# Patient Record
Sex: Male | Born: 1965 | Race: Black or African American | Hispanic: No
Health system: Midwestern US, Academic
[De-identification: ages and names within clinical notes are randomized; demographics above are authoritative.]

---

## 2006-03-31 ENCOUNTER — Ambulatory Visit (HOSPITAL_COMMUNITY): Admission: RE | Admit: 2006-03-31 | Discharge: 2006-03-31 | Payer: Self-pay | Admitting: Orthopedic Surgery

## 2007-08-11 IMAGING — CR DG CHEST 2V
2 series · 2 of 2 positions shown · non-contrast
Comparison: none

CLINICAL DATA: Preoperative respiratory exam for orthopedic surgery.  Smoking history.  Sleep apnea. 
 CHEST - 2 VIEW:

[view not recorded (1 of 2)]
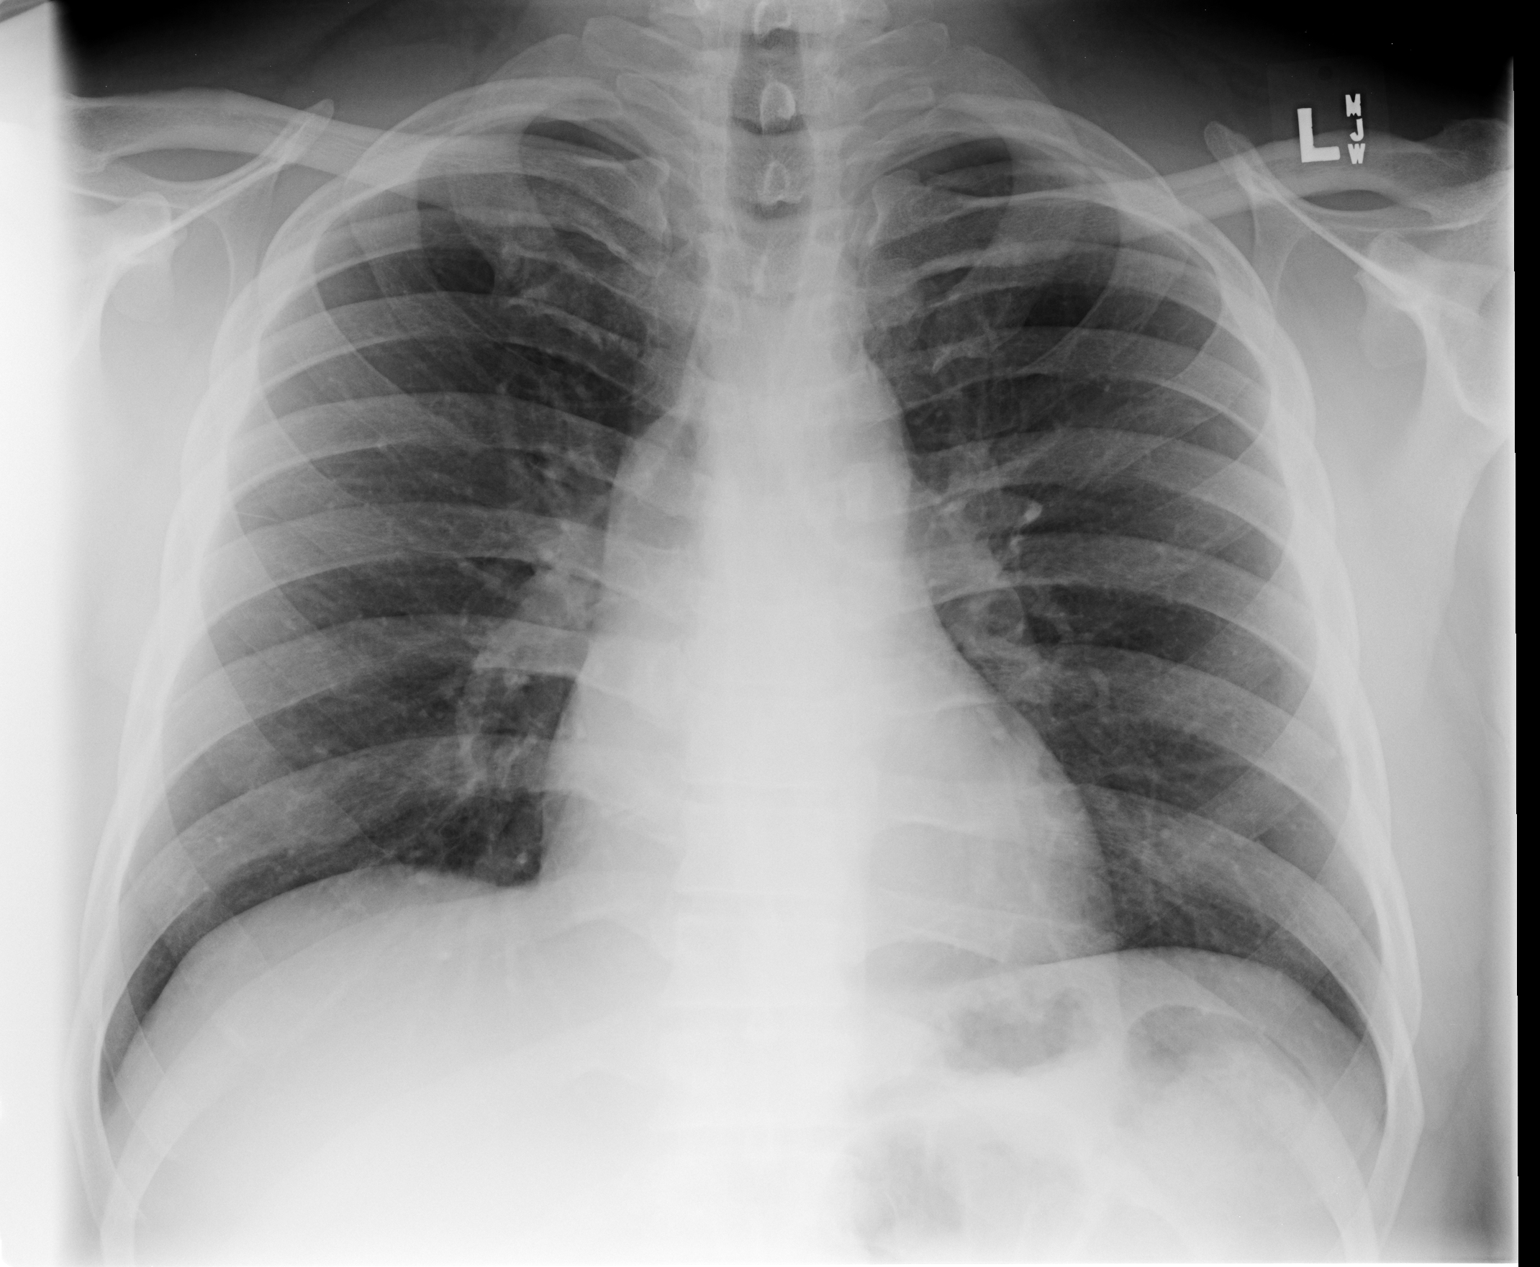

[view not recorded (2 of 2)]
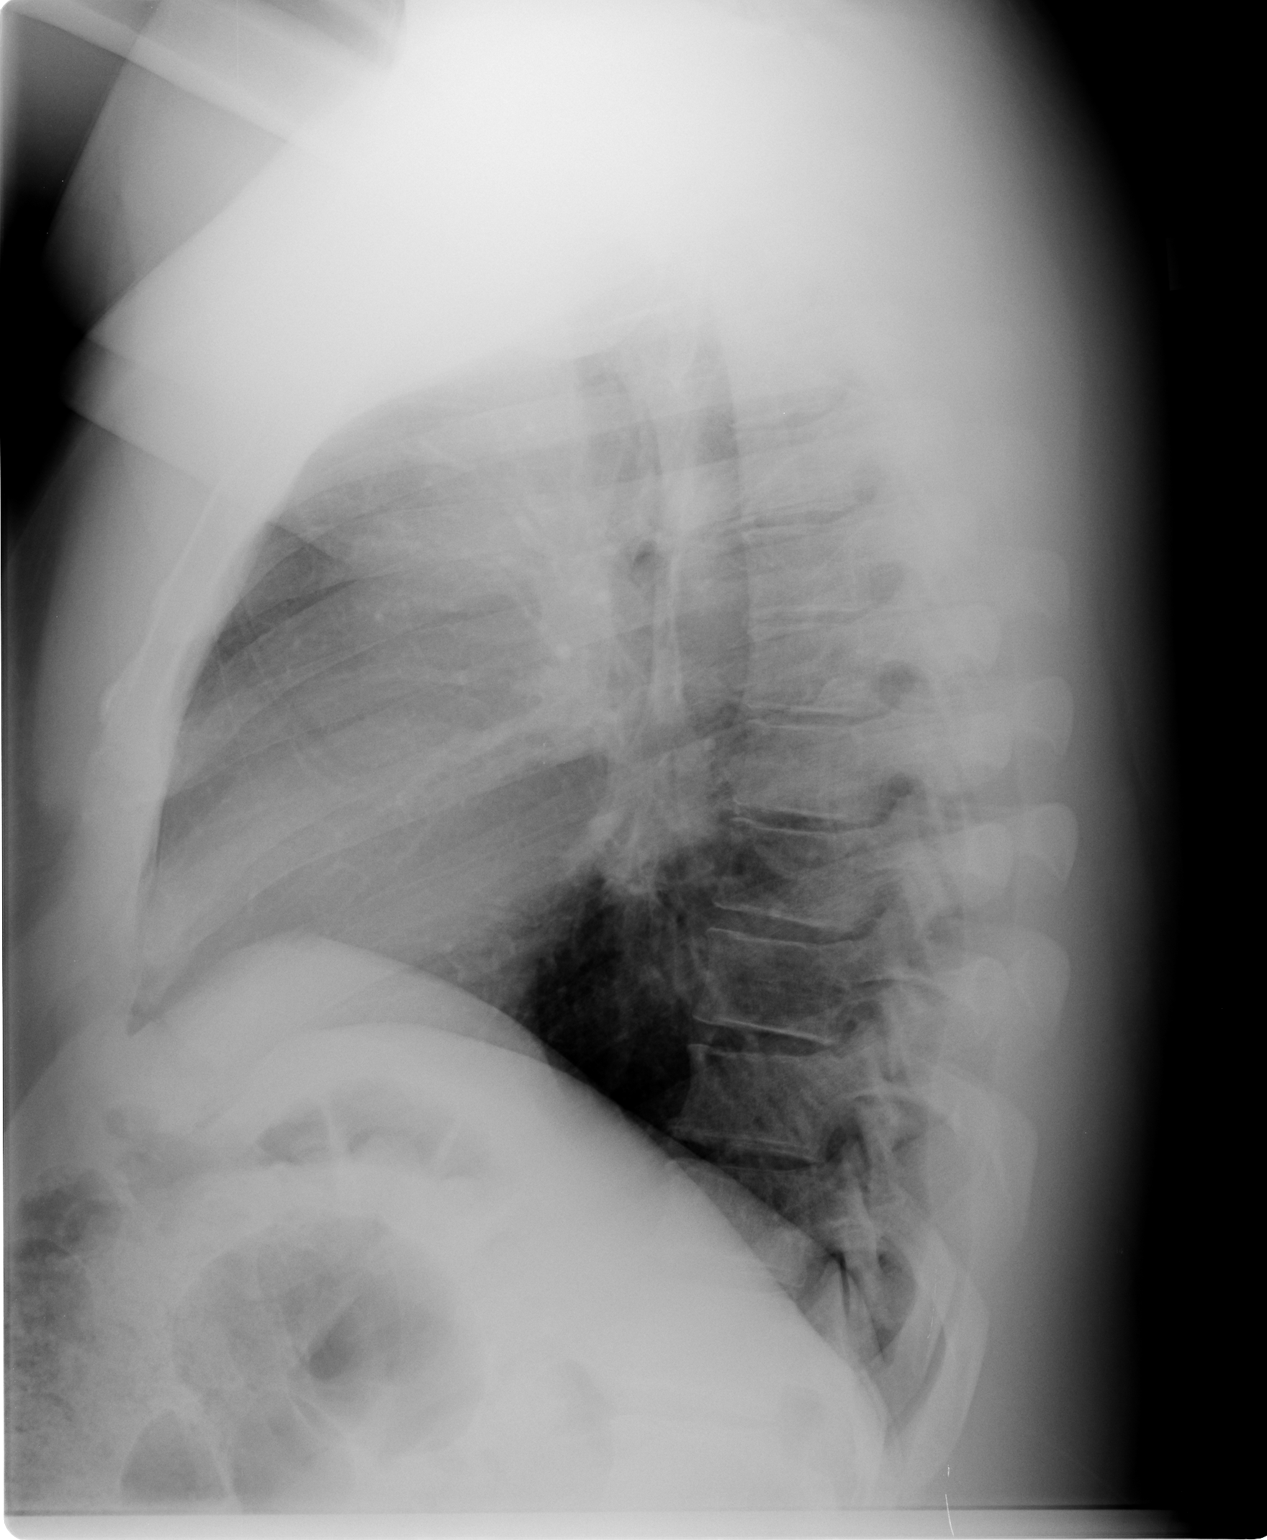

[2 of 2 positions shown; findings below may reference images not displayed]

FINDINGS: The heart size and mediastinal contours are within normal limits.  Both lungs are clear.  The visualized skeletal structures are unremarkable.
IMPRESSION: No active cardiopulmonary disease.

## 2013-10-27 ENCOUNTER — Telehealth: Payer: Self-pay | Admitting: Radiology

## 2013-10-27 NOTE — Telephone Encounter (Signed)
Patient wants order for CPAP, most recent sleep study was 2006, please advise if you can order (pended). Titration suggested setting machine at 16 cm H2O

## 2013-10-27 NOTE — Telephone Encounter (Signed)
Sorry--needs PCP to order(could reestablish here with someone else for CPE to get process restarted)

## 2013-11-01 NOTE — Telephone Encounter (Signed)
Thanks left message for patient to advise.

## 2016-02-21 ENCOUNTER — Ambulatory Visit
Admit: 2016-02-21 | Discharge: 2016-02-21 | Payer: BLUE CROSS/BLUE SHIELD | Attending: Internal Medicine | Primary: Internal Medicine

## 2016-02-21 DIAGNOSIS — I10 Essential (primary) hypertension: Secondary | ICD-10-CM

## 2016-02-21 NOTE — Progress Notes (Signed)
Subjective:      Patient ID: Marcus Castro is a 50 y.o. male.    Hyperlipidemia   This is a chronic problem. The problem is controlled. Recent lipid tests were reviewed and are normal. He has no history of diabetes. There are no known factors aggravating his hyperlipidemia. Current antihyperlipidemic treatment includes diet change. The current treatment provides significant improvement of lipids. There are no compliance problems.  Risk factors for coronary artery disease include male sex.   Knee Pain    The incident occurred at home. There was no injury mechanism. The pain is present in the left knee and right knee. The patient is experiencing no pain.       Review of Systems   Constitutional: Negative.    HENT: Negative.    Eyes: Negative.    Respiratory: Negative.    Cardiovascular: Negative.    Gastrointestinal: Negative.    Genitourinary: Negative.    Musculoskeletal: Negative.    Skin: Negative.    Neurological: Negative.    Psychiatric/Behavioral: Negative.        Objective:   Physical Exam   Constitutional: He is oriented to person, place, and time. He appears well-developed and well-nourished.   HENT:   Head: Normocephalic and atraumatic.   Left Ear: External ear normal.   Eyes: Conjunctivae and EOM are normal. Pupils are equal, round, and reactive to light.   Neck: Normal range of motion. Neck supple. No thyromegaly present.   Cardiovascular: Normal rate, regular rhythm and normal heart sounds.    No murmur heard.  Pulmonary/Chest: Effort normal and breath sounds normal. No respiratory distress. He has no wheezes. He has no rales.   Abdominal: Soft. Bowel sounds are normal.   Musculoskeletal: Normal range of motion.   Neurological: He is alert and oriented to person, place, and time.   Skin: Skin is warm and dry.   Psychiatric: He has a normal mood and affect.       Assessment:     hyperlipidemia on diet    left knee pain    Right knee pain   Plan:     continue meds    Labs    EKG    Return in 6 months

## 2016-02-22 LAB — HEMOGLOBIN A1C
Hemoglobin A1C: 5.9 %
eAG: 122.6 mg/dL

## 2016-02-22 LAB — CBC
Hematocrit: 42.9 % (ref 40.5–52.5)
Hemoglobin: 14 g/dL (ref 13.5–17.5)
MCH: 30.1 pg (ref 26.0–34.0)
MCHC: 32.6 g/dL (ref 31.0–36.0)
MCV: 92.1 fL (ref 80.0–100.0)
MPV: 9.8 fL (ref 5.0–10.5)
Platelets: 177 10*3/uL (ref 135–450)
RBC: 4.65 M/uL (ref 4.20–5.90)
RDW: 13.2 % (ref 12.4–15.4)
WBC: 4.7 10*3/uL (ref 4.0–11.0)

## 2016-02-22 LAB — COMPREHENSIVE METABOLIC PANEL
ALT: 24 U/L (ref 10–40)
AST: 28 U/L (ref 15–37)
Albumin/Globulin Ratio: 1.8 (ref 1.1–2.2)
Albumin: 4.7 g/dL (ref 3.4–5.0)
Alkaline Phosphatase: 64 U/L (ref 40–129)
Anion Gap: 14 (ref 3–16)
BUN: 21 mg/dL — ABNORMAL HIGH (ref 7–20)
CO2: 27 mmol/L (ref 21–32)
Calcium: 9.8 mg/dL (ref 8.3–10.6)
Chloride: 103 mmol/L (ref 99–110)
Creatinine: 1.1 mg/dL (ref 0.9–1.3)
GFR African American: >60 (ref >60)
GFR Non-African American: >60 (ref >60)
Globulin: 2.6 g/dL
Glucose: 87 mg/dL (ref 70–99)
Potassium: 4.7 mmol/L (ref 3.5–5.1)
Sodium: 144 mmol/L (ref 136–145)
Total Bilirubin: 0.4 mg/dL (ref 0.0–1.0)
Total Protein: 7.3 g/dL (ref 6.4–8.2)

## 2016-02-22 LAB — LIPID PANEL
Cholesterol, Total: 193 mg/dL (ref 0–199)
HDL: 54 mg/dL (ref 40–60)
LDL Calculated: 119 mg/dL — ABNORMAL HIGH (ref <100)
Triglycerides: 101 mg/dL (ref 0–150)
VLDL Cholesterol Calculated: 20 mg/dL

## 2016-02-22 LAB — PSA SCREENING: PSA: 0.73 ng/mL (ref 0.00–4.00)

## 2016-02-23 LAB — TESTOSTERONE FREE AND TOTAL MALE
Sex Hormone Binding: 31 nmol/L (ref 11–80)
Testosterone % Free: 1.8 % (ref 1.6–2.9)
Testosterone Free, Calc: 46 pg/mL — ABNORMAL LOW (ref 47–244)
Total Testosterone: 249 ng/dL — ABNORMAL LOW (ref 300–890)

## 2016-05-23 ENCOUNTER — Ambulatory Visit
Admit: 2016-05-23 | Discharge: 2016-05-23 | Payer: BLUE CROSS/BLUE SHIELD | Attending: Internal Medicine | Primary: Internal Medicine

## 2016-05-23 DIAGNOSIS — G4733 Obstructive sleep apnea (adult) (pediatric): Secondary | ICD-10-CM

## 2016-05-23 NOTE — Progress Notes (Signed)
Subjective:      Patient ID: Marcus Castro is a 50 y.o. male.    Fatigue   This is a chronic problem. The problem has been unchanged. Associated symptoms include fatigue. Nothing aggravates the symptoms. He has tried nothing for the symptoms.   Hypertension   This is a chronic problem. The problem is controlled. Pertinent negatives include no anxiety. There are no associated agents to hypertension. Risk factors for coronary artery disease include diabetes mellitus, male gender and obesity. Past treatments include lifestyle changes.       Review of Systems   Constitutional: Positive for fatigue.   HENT: Negative.    Eyes: Negative.    Respiratory: Negative.    Cardiovascular: Negative.    Gastrointestinal: Negative.    Genitourinary: Negative.    Musculoskeletal: Negative.    Skin: Negative.    Neurological: Negative.    Psychiatric/Behavioral: Negative.        Objective:   Physical Exam   Constitutional: He is oriented to person, place, and time. He appears well-developed and well-nourished.   HENT:   Head: Normocephalic and atraumatic.   Left Ear: External ear normal.   Eyes: Conjunctivae and EOM are normal. Pupils are equal, round, and reactive to light.   Neck: Normal range of motion. Neck supple. No thyromegaly present.   Cardiovascular: Normal rate, regular rhythm and normal heart sounds.    No murmur heard.  Pulmonary/Chest: Effort normal and breath sounds normal. No respiratory distress. He has no wheezes. He has no rales.   Abdominal: Soft. Bowel sounds are normal.   Musculoskeletal: Normal range of motion.   Neurological: He is alert and oriented to person, place, and time.   Skin: Skin is warm and dry.   Psychiatric: He has a normal mood and affect.       Assessment:     chronic fatigue    Daytime sleepiness    Sleep apnea as new diagnosis      Plan:     refer to [facility] for supplies    Return in 3 months

## 2016-08-22 ENCOUNTER — Ambulatory Visit
Admit: 2016-08-22 | Discharge: 2016-08-22 | Payer: BLUE CROSS/BLUE SHIELD | Attending: Internal Medicine | Primary: Internal Medicine

## 2016-08-22 DIAGNOSIS — I10 Essential (primary) hypertension: Secondary | ICD-10-CM

## 2016-08-22 MED ORDER — AMLODIPINE BESYLATE 5 MG PO TABS
5 MG | ORAL_TABLET | Freq: Every day | ORAL | 3 refills | Status: DC
Start: 2016-08-22 — End: 2022-10-29

## 2016-08-22 NOTE — Patient Instructions (Signed)
Patient Education        DASH Diet: Care Instructions  Your Care Instructions    The DASH diet is an eating plan that can help lower your blood pressure. DASH stands for Dietary Approaches to Stop Hypertension. Hypertension is high blood pressure.  The DASH diet focuses on eating foods that are high in calcium, potassium, and magnesium. These nutrients can lower blood pressure. The foods that are highest in these nutrients are fruits, vegetables, low-fat dairy products, nuts, seeds, and legumes. But taking calcium, potassium, and magnesium supplements instead of eating foods that are high in those nutrients does not have the same effect. The DASH diet also includes whole grains, fish, and poultry.  The DASH diet is one of several lifestyle changes your doctor may recommend to lower your high blood pressure. Your doctor may also want you to decrease the amount of sodium in your diet. Lowering sodium while following the DASH diet can lower blood pressure even further than just the DASH diet alone.  Follow-up care is a key part of your treatment and safety. Be sure to make and go to all appointments, and call your doctor if you are having problems. It's also a good idea to know your test results and keep a list of the medicines you take.  How can you care for yourself at home?  Following the DASH diet  ?? Eat 4 to 5 servings of fruit each day. A serving is 1 medium-sized piece of fruit, ?? cup chopped or canned fruit, 1/4 cup dried fruit, or 4 ounces (?? cup) of fruit juice. Choose fruit more often than fruit juice.  ?? Eat 4 to 5 servings of vegetables each day. A serving is 1 cup of lettuce or raw leafy vegetables, ?? cup of chopped or cooked vegetables, or 4 ounces (?? cup) of vegetable juice. Choose vegetables more often than vegetable juice.  ?? Get 2 to 3 servings of low-fat and fat-free dairy each day. A serving is 8 ounces of milk, 1 cup of yogurt, or 1 ?? ounces of cheese.  ?? Eat 6 to 8 servings of grains each day.  A serving is 1 slice of bread, 1 ounce of dry cereal, or ?? cup of cooked rice, pasta, or cooked cereal. Try to choose whole-grain products as much as possible.  ?? Limit lean meat, poultry, and fish to 2 servings each day. A serving is 3 ounces, about the size of a deck of cards.  ?? Eat 4 to 5 servings of nuts, seeds, and legumes (cooked dried beans, lentils, and split peas) each week. A serving is 1/3 cup of nuts, 2 tablespoons of seeds, or ?? cup of cooked beans or peas.  ?? Limit fats and oils to 2 to 3 servings each day. A serving is 1 teaspoon of vegetable oil or 2 tablespoons of salad dressing.  ?? Limit sweets and added sugars to 5 servings or less a week. A serving is 1 tablespoon jelly or jam, ?? cup sorbet, or 1 cup of lemonade.  ?? Eat less than 2,300 milligrams (mg) of sodium a day. If you limit your sodium to 1,500 mg a day, you can lower your blood pressure even more.  Tips for success  ?? Start small. Do not try to make dramatic changes to your diet all at once. You might feel that you are missing out on your favorite foods and then be more likely to not follow the plan. Make small changes, and stick   with them. Once those changes become habit, add a few more changes.  ?? Try some of the following:  ?? Make it a goal to eat a fruit or vegetable at every meal and at snacks. This will make it easy to get the recommended amount of fruits and vegetables each day.  ?? Try yogurt topped with fruit and nuts for a snack or healthy dessert.  ?? Add lettuce, tomato, cucumber, and onion to sandwiches.  ?? Combine a ready-made pizza crust with low-fat mozzarella cheese and lots of vegetable toppings. Try using tomatoes, squash, spinach, broccoli, carrots, cauliflower, and onions.  ?? Have a variety of cut-up vegetables with a low-fat dip as an appetizer instead of chips and dip.  ?? Sprinkle sunflower seeds or chopped almonds over salads. Or try adding chopped walnuts or almonds to cooked vegetables.  ?? Try some vegetarian  meals using beans and peas. Add garbanzo or kidney beans to salads. Make burritos and tacos with mashed pinto beans or black beans.  Where can you learn more?  Go to [email or web address] and sign in to your MyChart account. Enter H967 in the Search Health Information box to learn more about "DASH Diet: Care Instructions."     If you do not have an account, please click on the "Sign Up Now" link.  Current as of: May 24, 2015  Content Version: 11.4  ?? 2006-2017 [employer]. Care instructions adapted under license by [facility]. If you have questions about a medical condition or this instruction, always ask your healthcare professional. [employer] disclaims any warranty or liability for your use of this information.

## 2016-08-22 NOTE — Progress Notes (Signed)
Subjective:      Patient ID: Marcus Castro is a 50 y.o. male.    Here for general follow up. Overall feeling ok.    Has not gotten cpap yet.    Left shoulder tenderness for past 1 year. Worse over past month. Limiting his ROM. No trauma. No heavy lifting that he can think of. Has not tried any medications. 6/10.  Flared up yesterday.    Fatigue unchanged.    ROS otherwise negative          Fatigue   This is a chronic problem. The problem has been unchanged. Associated symptoms include fatigue. Nothing aggravates the symptoms. He has tried nothing for the symptoms.   Hypertension   This is a chronic problem. The problem is controlled. Pertinent negatives include no anxiety. There are no associated agents to hypertension. Risk factors for coronary artery disease include diabetes mellitus, male gender and obesity. Past treatments include lifestyle changes.   Shoulder Pain    The pain is present in the left shoulder. This is a chronic problem. The current episode started more than 1 month ago. There has been no history of extremity trauma. The problem occurs daily. The problem has been unchanged. The quality of the pain is described as aching. The pain is at a severity of 6/10. The pain is moderate. Associated symptoms include an inability to bear weight and a limited range of motion. The symptoms are aggravated by activity. He has tried nothing for the symptoms.       Review of Systems   Constitutional: Positive for fatigue.   HENT: Negative.    Eyes: Negative.    Respiratory: Negative.    Cardiovascular: Negative.    Gastrointestinal: Negative.    Genitourinary: Negative.    Musculoskeletal: Negative.    Skin: Negative.    Neurological: Negative.    Psychiatric/Behavioral: Negative.        Objective:   Physical Exam   Constitutional: He is oriented to person, place, and time. He appears well-developed and well-nourished.   HENT:   Head: Normocephalic and atraumatic.   Left Ear: External ear normal.   Eyes: Conjunctivae  and EOM are normal. Pupils are equal, round, and reactive to light.   Neck: Normal range of motion. Neck supple. No thyromegaly present.   Cardiovascular: Normal rate, regular rhythm and normal heart sounds.    No murmur heard.  Pulmonary/Chest: Effort normal and breath sounds normal. No respiratory distress. He has no wheezes. He has no rales.   Abdominal: Soft. Bowel sounds are normal. He exhibits no distension. There is no tenderness. There is no rebound.   Musculoskeletal: He exhibits no edema or deformity.        Left shoulder: He exhibits decreased range of motion, tenderness (anterior), pain and decreased strength. He exhibits no bony tenderness, no swelling, no effusion and no crepitus.   Neurological: He is alert and oriented to person, place, and time.   Skin: Skin is warm and dry.   Psychiatric: He has a normal mood and affect. His behavior is normal. Judgment and thought content normal.   Nursing note and vitals reviewed.      Assessment:     Chronic fatigue    Daytime sleepiness    Left shoulder pain    Sleep apnea as new diagnosis    Obesity    Hyperlipidemia- spoke to patient regarding LDL, states will work on diet and exercise    Essential HTN- not controlled despite trying exercise  and diet      Plan:      Start amlodipine 5mg  qd  Refer to [facility] for supplies- CPAP    Goal: 3x weekly exercise , 12lbs weight loss by next visit    Recommended  ibuprofen, ortho referral to Dr. Valla LeaverShockley    - Pt encouraged to increase physical activity once discharged    - pt educated on healthy dietary habits - lean meats, vegetables, fruits avoiding fried foods etc.   Labs    Return in 6 weeks follow up of HTN    Franki CabotAkua Jodean Valade, MD  Internal Medicine, PGY-2  Pager: [phone]  08/22/16  5:07 PM    This patient has been staffed and discussed with attending Tonia BroomsKeith Melvin, MD.

## 2016-08-23 LAB — COMPREHENSIVE METABOLIC PANEL
ALT: 23 U/L (ref 10–40)
AST: 29 U/L (ref 15–37)
Albumin/Globulin Ratio: 1.6 (ref 1.1–2.2)
Albumin: 4.5 g/dL (ref 3.4–5.0)
Alkaline Phosphatase: 66 U/L (ref 40–129)
Anion Gap: 13 (ref 3–16)
BUN: 19 mg/dL (ref 7–20)
CO2: 26 mmol/L (ref 21–32)
Calcium: 9.5 mg/dL (ref 8.3–10.6)
Chloride: 102 mmol/L (ref 99–110)
Creatinine: 1 mg/dL (ref 0.9–1.3)
GFR African American: >60 (ref >60)
GFR Non-African American: >60 (ref >60)
Globulin: 2.9 g/dL
Glucose: 87 mg/dL (ref 70–99)
Potassium: 4.2 mmol/L (ref 3.5–5.1)
Sodium: 141 mmol/L (ref 136–145)
Total Bilirubin: 0.3 mg/dL (ref 0.0–1.0)
Total Protein: 7.4 g/dL (ref 6.4–8.2)

## 2016-08-23 LAB — LIPID PANEL
Cholesterol, Total: 203 mg/dL — ABNORMAL HIGH (ref 0–199)
HDL: 55 mg/dL (ref 40–60)
LDL Calculated: 130 mg/dL — ABNORMAL HIGH (ref <100)
Triglycerides: 89 mg/dL (ref 0–150)
VLDL Cholesterol Calculated: 18 mg/dL

## 2016-10-03 ENCOUNTER — Encounter: Attending: Internal Medicine | Primary: Internal Medicine

## 2018-08-17 NOTE — Progress Notes (Signed)
Formatting of this note is different from the original.    Subjective   Subjective:    Marcus Castro is a 52 year old male who presents today with   Chief Complaint   Patient presents with   ? Follow-up     follow up on abnormal lab results      Patient was seen previusly for low back pain with radiation down both legs.  He was referred for physical therapy but has first appt scheduled but note yet obtained.  He denies any changes in symptoms which have impacted his ability to exercise as per his previous level.  He continues to deny saddle anesthesia, numbness or weakness or lower extremities or urinary/bowel symptoms.  His pain level, previously descried as 10/10 now 9/10.    His labs were remarkable for vit D deficiency as well as elevated A1c consistent with prediabetes.  His diet is fairly balanced, but he admits to some processed carbs.  He has family history of diabetes.      His SBP remains elevated despite normalization of DBP. He admits having been drinking beer about every other day, but otherwise follwing low salt diet.      Health Maintenance   Topic Date Due   ? Pneumococcal 0-64 (1 of 1 - PPSV23) 03/14/1972   ? DTap,Tdap,and Td (1 - Tdap) 03/14/1977   ? COLONOSCOPY SCREENING  03/14/2016   ? PSA YEARLY  03/14/2016   ? Shingrix (1) 03/14/2016   ? Influenza Vaccine (1) 05/03/2018   ? Meningococcal conjugate valent 4 (MCV4)  Aged Out     No past medical history on file.    Past Surgical History:   Procedure Laterality Date   ? ANTERIOR CRUCIATE LIGAMENT REPAIR Right        History   Alcohol Use   ? Yes     Comment: Socially     Social History     Tobacco Use   Smoking Status Light Tobacco Smoker   ? Types: Cigars   Smokeless Tobacco Never Used   Tobacco Comment    2 Cigars a week     Ready to quit: Not Answered  Counseling given: Not Answered  Comment: 2 Cigars a week    Family History   Problem Relation Age of Onset   ? High BP Mother    ? Cancer Father         lung   ? Diabetes Maternal Aunt    ? Diabetes  Paternal Cousin    ? Diabetes Maternal Cousin      No Known Allergies    Outpatient Medications Marked as Taking for the 08/17/18 encounter (Office Visit) with Gwendalyn Ege., MD   Medication Sig Dispense Refill   ? Cholecalciferol (VITAMIN D) 50 MCG (2000 UT) CAPS Take 1 tablet by mouth daily for 90 days. 90 capsule 1   ? Blood Pressure Monitoring (BLOOD PRESSURE KIT) KIT Large cuff size 1 each 0   ? ibuprofen (ADVIL,MOTRIN) 200 MG TABS Take 200 mg by mouth every 6 (six) hours as needed for Mild Pain (1-3).       Review of Systems   Gastrointestinal: Negative for abdominal pain.   Genitourinary: Negative for difficulty urinating and frequency.   Musculoskeletal: Positive for back pain. Negative for arthralgias, gait problem, joint swelling and myalgias.   Neurological: Negative for tremors, weakness and numbness.     Vitals:    08/17/18 1726   BP: 150/80   Weight: 290  lb 9.6 oz (131.8 kg)   Height: 71" (180.3 cm)     Body mass index is 40.53 kg/m.         Objective   Objective:  Physical Exam   Constitutional: He is oriented to person, place, and time. He appears well-developed and well-nourished. No distress.   Pulmonary/Chest: No respiratory distress.   Musculoskeletal:        Lumbar back: He exhibits decreased range of motion, tenderness and pain. He exhibits no bony tenderness, no swelling, no edema, no deformity and no spasm.   Neurological: He is alert and oriented to person, place, and time. He has normal strength. No sensory deficit. He exhibits normal muscle tone. Coordination and gait normal.   Psychiatric: He has a normal mood and affect. His behavior is normal.   Nursing note and vitals reviewed.    Procedures    Chronic Disease Management    Assessment & Plan:  Lonnie was seen today for follow-up.    Diagnoses and all orders for this visit:    Elevated blood pressure reading in office without diagnosis of hypertension-likely htn but patient would like to continue to work on diet and  exercise  -may also be pain component to elevation  -discussed sodium restriction, achievng ideal body weight and regular exercise program when able, as physiologic means to achieve blood pressure control. Consider stationary bike.  The patient will strive towards this. The patient indicates understanding of these issues and agrees with the plan  -home blood pressure monitoring with goal <140/90; patient to return sooner if trending over goal range    Prediabetes-extensive discussion re potential progression to diabetes without intervention.  Will start with low carb diet, avoiding processed foods and limiting starchy carbs in diet; regular mealtimes; graduated low impact exercise regimen  -consider medication management if A1c unimproved/worsening with diet and exercise management  -     HEMOGLOBIN A1C; Future-3 months    Vitamin D deficiency-repeat after 6 months supplementation  -counseled patient re role of vitamin D and potential effects deficiency, in addition to treatment protocol     Chronic bilateral low back pain without sciatica-await therapy appt  -     AMB REFERRAL TO [facility]; Future    Morbid obesity with BM of 40-44.9, adult-Extensive discussion re diagnosis of obesity and the negative impact on co-existing conditions.  Extensive counseling re low carb diet with avoidance of processed/starchy carbs in diet as well as sugar-sweetened beverages.  Patient educated re need for regular mealtimes and importance of not skipping/bingeing meals.  Avoid fast food chains and vending machine products.  Incorporate consistent exercise regimen as tolerated due to back pain with goal of a minimum 30 min daily for at least 4 days per week to achieve weight loss rather than just maintaining current weight.  Adding 2-3 days light resistance training is also helpful to build lean body weight with more fat-burning capacity.  Limit weight training to seated machines rather than free weights or dead lifts  while back pain persists.   Advised patient to monitor progress with changes in endurance and clothing fit rather than weighing on scale, which will fluctuate and tend to increase both anxiety and frustration.    Other orders  -     Cholecalciferol (VITAMIN D) 50 MCG (2000 UT) CAPS; Take 1 tablet by mouth daily for 90 days.  -     Blood Pressure Monitoring (BLOOD PRESSURE KIT) KIT; Large cuff size  Return in about 3 months (around 11/16/2018), or sooner if new or worsening symptms.  Electronically signed by Gwendalyn Ege., MD at 08/17/2018  9:47 PM EST

## 2019-12-24 ENCOUNTER — Ambulatory Visit: Admit: 2019-12-24 | Discharge: 2019-12-24 | Payer: PRIVATE HEALTH INSURANCE

## 2019-12-24 ENCOUNTER — Ambulatory Visit: Admit: 2019-12-24 | Payer: PRIVATE HEALTH INSURANCE

## 2019-12-24 DIAGNOSIS — Z0000 Encounter for general adult medical examination without abnormal findings: Secondary | ICD-10-CM

## 2019-12-24 LAB — COMPREHENSIVE METABOLIC PANEL, SERUM
ALT: 28 U/L (ref 7–52)
AST (SGOT): 32 U/L (ref 13–39)
Albumin: 4.3 g/dL (ref 3.5–5.7)
Alkaline Phosphatase: 60 U/L (ref 36–125)
Anion Gap: 7 mmol/L (ref 3–16)
BUN: 19 mg/dL (ref 7–25)
CO2: 30 mmol/L (ref 21–33)
Calcium: 9.9 mg/dL (ref 8.6–10.3)
Chloride: 107 mmol/L (ref 98–110)
Creatinine: 0.99 mg/dL (ref 0.60–1.30)
Glucose: 100 mg/dL (ref 70–100)
Osmolality, Calculated: 300 mOsm/kg (ref 278–305)
Potassium: 4.8 mmol/L (ref 3.5–5.3)
Sodium: 144 mmol/L (ref 133–146)
Total Bilirubin: 0.6 mg/dL (ref 0.0–1.5)
Total Protein: 7.1 g/dL (ref 6.4–8.9)
eGFR AA CKD-EPI: >90 See note.
eGFR NONAA CKD-EPI: 87 See note.

## 2019-12-24 LAB — HEMOGLOBIN A1C: Hemoglobin A1C: 5.9 % (ref 4.0–5.6)

## 2019-12-24 LAB — LIPID PANEL
Cholesterol, Total: 171 mg/dL (ref 0–200)
HDL: 62 mg/dL (ref 60–92)
LDL Cholesterol: 98 mg/dL
Triglycerides: 56 mg/dL (ref 10–149)

## 2019-12-24 LAB — HEPATITIS C ANTIBODY: HCV Ab: NONREACTIVE

## 2019-12-24 LAB — PSA TOTAL, SCREENING: PSA: 0.52 ng/mL (ref 0.0–4.0)

## 2019-12-24 LAB — HIV 1+2 ANTIBODY/ANTIGEN WITH REFLEX: HIV 1+2 AB/AGN: NONREACTIVE

## 2019-12-24 NOTE — Unmapped (Signed)
Subjective:       Patient ID: Ronald Oconnor is a 54 y.o. male.    HPI  OSA, low back pain  lbp that radiates down left side. feels it in the morning the builds up during the day. Can feel it over night. Difficult to stand straight, leaning back helps.. stretching has helped for back. Losing weight has helped. Pt was 286lbs and losing weight has helped.. no numbness or tingling. Pt has physical job in [employer].has some weakness with lifting     Intermittent fasting, latest meal at 6pm,5 out of 7 days of veggies  Stretching and treadmill/rowing    Has sleep apnea    The following portions of the patient's history were reviewed and updated as appropriate: allergies, current medications, past family history, past medical history, past social history, past surgical history and problem list.        Review of Systems   Constitutional: Positive for weight loss. Negative for weight gain.   HENT: Negative for congestion, hearing loss and rhinorrhea.    Eyes: Positive for visual disturbance (floaters right eye).   Respiratory: Negative for shortness of breath.    Cardiovascular: Negative for chest pain.   Gastrointestinal: Negative for constipation, diarrhea and heartburn.   Genitourinary: Positive for urgency. Negative for difficulty urinating and nocturia.   Musculoskeletal: Positive for arthralgias (mild shoulder pain at times) and back pain.   Neurological: Negative for headaches.   Psychiatric/Behavioral: Negative for depression and sleep disturbance.       Objective:    Physical Exam  Vitals signs and nursing note reviewed.   Constitutional:       Appearance: He is well-developed.   HENT:      Head: Normocephalic and atraumatic.      Right Ear: Tympanic membrane, ear canal and external ear normal.      Left Ear: Tympanic membrane, ear canal and external ear normal.   Eyes:      Conjunctiva/sclera: Conjunctivae normal.      Pupils: Pupils are equal, round, and reactive to light.   Neck:      Musculoskeletal: Normal  range of motion and neck supple.      Thyroid: No thyromegaly.      Trachea: No tracheal deviation.   Cardiovascular:      Rate and Rhythm: Normal rate and regular rhythm.      Heart sounds: Normal heart sounds.   Pulmonary:      Effort: Pulmonary effort is normal. No respiratory distress.      Breath sounds: Normal breath sounds. No wheezing or rales.   Abdominal:      General: Bowel sounds are normal. There is no distension.      Palpations: Abdomen is soft.      Tenderness: There is no abdominal tenderness. There is no guarding or rebound.   Musculoskeletal:         General: No tenderness.      Right lower leg: No edema.      Left lower leg: No edema.      Comments: Negative slr  +apley left shoulder   Lymphadenopathy:      Cervical: No cervical adenopathy.   Skin:     General: Skin is warm and dry.   Neurological:      General: No focal deficit present.      Mental Status: He is alert and oriented to person, place, and time.   Psychiatric:         Behavior: Behavior  normal.         Thought Content: Thought content normal.       Vitals:    12/24/19 0823   BP: (!) 163/92   Pulse: 74   SpO2: 100%     BP: 148/89           90 degree rigt shoulder abduction  75 degrees left abduction  Assessment:   Please see below     Plan:   Annual exam      goal:240lbs    htn-

## 2019-12-24 NOTE — Unmapped (Addendum)
Back Exercises  If you have pain in your back, do these exercises 2-3 times each day or as told by your doctor. When the pain goes away, do the exercises once each day, but repeat the steps more times for each exercise (do more repetitions). If you do not have pain in your back, do these exercises once each day or as told by your doctor.  Exercises  Single Knee to Chest    Do these steps 3-5 times in a row for each leg:  1. Lie on your back on a firm bed or the floor with your legs stretched out.  2. Bring one knee to your chest.  3. Hold your knee to your chest by grabbing your knee or thigh.  4. Pull on your knee until you feel a gentle stretch in your lower back.  5. Keep doing the stretch for 10-30 seconds.  6. Slowly let go of your leg and straighten it.  Pelvic Tilt    Do these steps 5-10 times in a row:  1. Lie on your back on a firm bed or the floor with your legs stretched out.  2. Bend your knees so they point up to the ceiling. Your feet should be flat on the floor.  3. Tighten your lower belly (abdomen) muscles to press your lower back against the floor. This will make your tailbone point up to the ceiling instead of pointing down to your feet or the floor.  4. Stay in this position for 5-10 seconds while you gently tighten your muscles and breathe evenly.  Cat-Cow    Do these steps until your lower back bends more easily:  1. Get on your hands and knees on a firm surface. Keep your hands under your shoulders, and keep your knees under your hips. You may put padding under your knees.  2. Let your head hang down, and make your tailbone point down to the floor so your lower back is round like the back of a cat.  3. Stay in this position for 5 seconds.  4. Slowly lift your head and make your tailbone point up to the ceiling so your back hangs low (sags) like the back of a cow.  5. Stay in this position for 5 seconds.  Press-Ups    Do these steps 5-10 times in a row:  1. Lie on your belly (face-down) on the  floor.  2. Place your hands near your head, about shoulder-width apart.  3. While you keep your back relaxed and keep your hips on the floor, slowly straighten your arms to raise the top half of your body and lift your shoulders. Do not use your back muscles. To make yourself more comfortable, you may change where you place your hands.  4. Stay in this position for 5 seconds.  5. Slowly return to lying flat on the floor.  Bridges    Do these steps 10 times in a row:  1. Lie on your back on a firm surface.  2. Bend your knees so they point up to the ceiling. Your feet should be flat on the floor.  3. Tighten your butt muscles and lift your butt off of the floor until your waist is almost as high as your knees. If you do not feel the muscles working in your butt and the back of your thighs, slide your feet 1-2 inches farther away from your butt.  4. Stay in this position for 3-5 seconds.  5. Slowly   lower your butt to the floor, and let your butt muscles relax.  If this exercise is too easy, try doing it with your arms crossed over your chest.  Belly Crunches    Do these steps 5-10 times in a row:  1. Lie on your back on a firm bed or the floor with your legs stretched out.  2. Bend your knees so they point up to the ceiling. Your feet should be flat on the floor.  3. Cross your arms over your chest.  4. Tip your chin a little bit toward your chest but do not bend your neck.  5. Tighten your belly muscles and slowly raise your chest just enough to lift your shoulder blades a tiny bit off of the floor.  6. Slowly lower your chest and your head to the floor.  Back Lifts  Do these steps 5-10 times in a row:  1. Lie on your belly (face-down) with your arms at your sides, and rest your forehead on the floor.  2. Tighten the muscles in your legs and your butt.  3. Slowly lift your chest off of the floor while you keep your hips on the floor. Keep the back of your head in line with the curve in your back. Look at the floor  while you do this.  4. Stay in this position for 3-5 seconds.  5. Slowly lower your chest and your face to the floor.    DASH Eating Plan  DASH stands for Dietary Approaches to Stop Hypertension. The DASH eating plan is a healthy eating plan that has been shown to reduce high blood pressure (hypertension). It may also reduce your risk for type 2 diabetes, heart disease, and stroke. The DASH eating plan may also help with weight loss.  What are tips for following this plan?  General guidelines  ?? Avoid eating more than 2,300 mg (milligrams) of salt (sodium) a day. If you have hypertension, you may need to reduce your sodium intake to 1,500 mg a day.  ?? Limit alcohol intake to no more than 1 drink a day for nonpregnant women and 2 drinks a day for men. One drink equals 12 oz of beer, 5 oz of wine, or 1?? oz of hard liquor.  ?? Work with your health care provider to maintain a healthy body weight or to lose weight. Ask what an ideal weight is for you.  ?? Get at least 30 minutes of exercise that causes your heart to beat faster (aerobic exercise) most days of the week. Activities may include walking, swimming, or biking.  ?? Work with your health care provider or diet and nutrition specialist (dietitian) to adjust your eating plan to your individual calorie needs.  Reading food labels  ?? Check food labels for the amount of sodium per serving. Choose foods with less than 5 percent of the Daily Value of sodium. Generally, foods with less than 300 mg of sodium per serving fit into this eating plan.  ?? To find whole grains, look for the word whole as the first word in the ingredient list.  Shopping  ?? Buy products labeled as low-sodium or no salt added.  ?? Buy fresh foods. Avoid canned foods and premade or frozen meals.  Cooking  ?? Avoid adding salt when cooking. Use salt-free seasonings or herbs instead of table salt or sea salt. Check with your health care provider or pharmacist before using salt substitutes.  ?? Do  not fry foods. Cook foods  using healthy methods such as baking, boiling, grilling, and broiling instead.  ?? Cook with heart-healthy oils, such as olive, canola, soybean, or sunflower oil.  Meal planning    ?? Eat a balanced diet that includes:  ?? 5 or more servings of fruits and vegetables each day. At each meal, try to fill half of your plate with fruits and vegetables.  ?? Up to 6-8 servings of whole grains each day.  ?? Less than 6 oz of lean meat, poultry, or fish each day. A 3-oz serving of meat is about the same size as a deck of cards. One egg equals 1 oz.  ?? 2 servings of low-fat dairy each day.  ?? A serving of nuts, seeds, or beans 5 times each week.  ?? Heart-healthy fats. Healthy fats called Omega-3 fatty acids are found in foods such as flaxseeds and coldwater fish, like sardines, salmon, and mackerel.  ?? Limit how much you eat of the following:  ?? Canned or prepackaged foods.  ?? Food that is high in trans fat, such as fried foods.  ?? Food that is high in saturated fat, such as fatty meat.  ?? Sweets, desserts, sugary drinks, and other foods with added sugar.  ?? Full-fat dairy products.  ?? Do not salt foods before eating.  ?? Try to eat at least 2 vegetarian meals each week.  ?? Eat more home-cooked food and less restaurant, buffet, and fast food.  ?? When eating at a restaurant, ask that your food be prepared with less salt or no salt, if possible.  What foods are recommended?  The items listed may not be a complete list. Talk with your dietitian about what dietary choices are best for you.  Grains  Whole-grain or whole-wheat bread. Whole-grain or whole-wheat pasta. Brown rice. Orpah Cobb. Bulgur. Whole-grain and low-sodium cereals. Pita bread. Low-fat, low-sodium crackers. Whole-wheat flour tortillas.  Vegetables  Fresh or frozen vegetables (raw, steamed, roasted, or grilled). Low-sodium or reduced-sodium tomato and vegetable juice. Low-sodium or reduced-sodium tomato sauce and tomato paste. Low-sodium or  reduced-sodium canned vegetables.  Fruits  All fresh, dried, or frozen fruit. Canned fruit in natural juice (without added sugar).  Meat and other protein foods  Skinless chicken or [place]. Ground chicken or [place]. Pork with fat trimmed off. Fish and seafood. Egg whites. Dried beans, peas, or lentils. Unsalted nuts, nut butters, and seeds. Unsalted canned beans. Lean cuts of beef with fat trimmed off. Low-sodium, lean deli meat.  Dairy  Low-fat (1%) or fat-free (skim) milk. Fat-free, low-fat, or reduced-fat cheeses. Nonfat, low-sodium ricotta or cottage cheese. Low-fat or nonfat yogurt. Low-fat, low-sodium cheese.  Fats and oils  Soft margarine without trans fats. Vegetable oil. Low-fat, reduced-fat, or light mayonnaise and salad dressings (reduced-sodium). Canola, safflower, olive, soybean, and sunflower oils. Avocado.  Seasoning and other foods  Herbs. Spices. Seasoning mixes without salt. Unsalted popcorn and pretzels. Fat-free sweets.  What foods are not recommended?  The items listed may not be a complete list. Talk with your dietitian about what dietary choices are best for you.  Grains  Baked goods made with fat, such as croissants, muffins, or some breads. Dry pasta or rice meal packs.  Vegetables  Creamed or fried vegetables. Vegetables in a cheese sauce. Regular canned vegetables (not low-sodium or reduced-sodium). Regular canned tomato sauce and paste (not low-sodium or reduced-sodium). Regular tomato and vegetable juice (not low-sodium or reduced-sodium). Rosita Fire. Olives.  Fruits  Canned fruit in a light or heavy syrup. Foy [place]  fruit. Fruit in cream or butter sauce.  Meat and other protein foods  Fatty cuts of meat. Ribs. Fried meat. Tomasa Blase. Sausage. Bologna and other processed lunch meats. Salami. Fatback. Hotdogs. Bratwurst. Salted nuts and seeds. Canned beans with added salt. Canned or smoked fish. Whole eggs or egg yolks. Chicken or [place] with skin.  Dairy  Whole or 2% milk, cream, and half-and-half.  Whole or full-fat cream cheese. Whole-fat or sweetened yogurt. Full-fat cheese. Nondairy creamers. Whipped toppings. Processed cheese and cheese spreads.  Fats and oils  Butter. Stick margarine. Lard. Shortening. Ghee. Bacon fat. Tropical oils, such as coconut, palm kernel, or palm oil.  Seasoning and other foods  Salted popcorn and pretzels. Onion salt, garlic salt, seasoned salt, table salt, and sea salt. Worcestershire sauce. Tartar sauce. Barbecue sauce. Teriyaki sauce. Soy sauce, including reduced-sodium. Steak sauce. Canned and packaged gravies. Fish sauce. Oyster sauce. Cocktail sauce. Horseradish that you find on the shelf. Ketchup. Mustard. Meat flavorings and tenderizers. Bouillon cubes. Hot sauce and Tabasco sauce. Premade or packaged marinades. Premade or packaged taco seasonings. Relishes. Regular salad dressings.  Where to find more information:  ?? National Heart, Lung, and Blood Institute: PopSteam.is  ?? American Heart Association: www.heart.org  Summary  ?? The DASH eating plan is a healthy eating plan that has been shown to reduce high blood pressure (hypertension). It may also reduce your risk for type 2 diabetes, heart disease, and stroke.  ?? With the DASH eating plan, you should limit salt (sodium) intake to 2,300 mg a day. If you have hypertension, you may need to reduce your sodium intake to 1,500 mg a day.  ?? When on the DASH eating plan, aim to eat more fresh fruits and vegetables, whole grains, lean proteins, low-fat dairy, and heart-healthy fats.  ?? Work with your health care provider or diet and nutrition specialist (dietitian) to adjust your eating plan to your individual calorie needs.  This information is not intended to replace advice given to you by your health care provider. Make sure you discuss any questions you have with your health care provider.  Document Released: 08/08/2011 Document Revised: 08/12/2016 Document Reviewed: 08/12/2016  Elsevier Interactive Patient Education  ?? 2017 Elsevier Inc.  Contact a doctor if:  ?? Your back pain gets a lot worse when you do an exercise.  ?? Your back pain does not lessen 2 hours after you exercise.  If you have any of these problems, stop doing the exercises. Do not do them again unless your doctor says it is okay.  Get help right away if:  ?? You have sudden, very bad back pain. If this happens, stop doing the exercises. Do not do them again unless your doctor says it is okay.

## 2020-01-18 ENCOUNTER — Ambulatory Visit: Admit: 2020-01-18 | Payer: PRIVATE HEALTH INSURANCE

## 2020-01-18 ENCOUNTER — Inpatient Hospital Stay: Admit: 2020-01-18 | Payer: PRIVATE HEALTH INSURANCE

## 2020-01-18 DIAGNOSIS — M19072 Primary osteoarthritis, left ankle and foot: Secondary | ICD-10-CM

## 2020-01-18 DIAGNOSIS — M722 Plantar fascial fibromatosis: Secondary | ICD-10-CM

## 2020-01-18 MED ORDER — predniSONE (DELTASONE) 10 MG tablet
10 | ORAL_TABLET | ORAL | 0 refills | Status: AC
Start: 2020-01-18 — End: 2020-11-14

## 2020-01-18 NOTE — Unmapped (Signed)
Per [facility] protocols, apt offered and accepted for within 72 hours.  Pt requested appt for today        Reason for Disposition  ??? MODERATE pain (e.g., interferes with normal activities, limping) and present > 3 days    Answer Assessment - Initial Assessment Questions  1. ONSET: When did the pain start?       Started about Friday / Saturday -Started as tender. Last two days, unable to pressure on the foot  2. LOCATION: Where is the pain located?       Left foot only -  Some soreness into the ankle -mainly on the bottom  Plantar fascitis?  Some swelling  3. PAIN: How bad is the pain?    (Scale 1-10; or mild, moderate, severe)    -  MILD (1-3): doesn't interfere with normal activities     -  MODERATE (4-7): interferes with normal activities (e.g., work or school) or awakens from sleep, limping     -  SEVERE (8-10): excruciating pain, unable to do any normal activities, unable to walk      8/10  At rest - less so, but more of an annoyance  4. WORK OR EXERCISE: Has there been any recent work or exercise that involved this part of the body?       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 CAUSE: What do you think is causing the foot pain?      Unsure - has been wearing some walking shoes  Has not yet tried arch support yet  6. OTHER SYMPTOMS: Do you have any other symptoms? (e.g., leg pain, rash, fever, numbness)      no    Protocols used: FOOT PAIN-A-[place]

## 2020-01-18 NOTE — Unmapped (Signed)
[facility]  [facility] INTERNAL MEDICINE AT [facility]  [address]  [place] [address]    Name:  Ronald Oconnor   MRN: [id number]  Date of Birth: Oct 08, 1965  Age: 54 y.o.       Chief complaint:  Chief Complaint   Patient presents with   ??? Foot Pain     left       HPI:  HPI    Patient here for acute visit for left foot pain. Started about 5 days ago. No trauma to the area. Got a new job at [employer] where he is walking on a lot of concrete. Pain mainly in the heel. Got a little better over the weekend but got acutely worse yesterday. Tries to avoid putting weight on the heel. Endorses swelling to the foot and pain when pressing on the calcaneus. No inflammation in the toes. Has had gout before but this is not the area where he regularly gets gout.     Pertinent ROS as above.    Problem List:  Patient Active Problem List   Diagnosis   ??? Essential hypertension   ??? Annual physical exam         Current Outpatient Medications:  Current Outpatient Medications   Medication Sig Dispense Refill   ??? predniSONE (DELTASONE) 10 MG tablet Take 4 tabs by mouth daily for 3 days, then 3 tabs daily for 3 days, then 2 tabs daily for 3 days, then 1 tab daily for 3 days, then stop. 30 tablet 0     No current facility-administered medications for this visit.        Allergies:  No Known Allergies    Vitals:    01/18/20 1125   BP: 139/83   BP Location: Left arm   Patient Position: Sitting   BP Cuff Size: Large   Pulse: 78   Temp: 97.5 ??F (36.4 ??C)   TempSrc: Skin   SpO2: 100%   Weight: (!) 264 lb (119.7 kg)   Height: 6' (1.829 m)     Body mass index is 35.8 kg/m??.    Physical Exam  Constitutional:       Appearance: He is obese.   Pulmonary:      Effort: Pulmonary effort is normal. No respiratory distress.   Musculoskeletal:      Comments: Has left shoe very lightly tied. Hurts when taking shoe on and off. Some nonspecific swelling of the left foot, no redness or induration. Only pain when pressing on the left heel  but no pain to light pressing. Pain with plantarflexion.   Neurological:      Mental Status: He is alert.   Psychiatric:         Mood and Affect: Mood normal.           Assessment and Plan:  1. Plantar fasciitis of left foot  predniSONE (DELTASONE) 10 MG tablet   2. Pain of left heel  X-ray Foot Left min 3-views     Presumption is plantar fasciitis. Will obtain xray since he can barely put weight on the leg to see if there are any other acute abnormalities. Will do prednisone burst and taper. Printed out information for exercises, advised heel lift. Will call with results. May need injection in foot but will work on conservative management first. Advised off work until Friday but he will call on Thursday with update.    Return if symptoms worsen or fail to improve.    Electronically  signed by: Garnette Scheuermann, MD 01/18/2020 12:03 PM

## 2020-01-18 NOTE — Patient Instructions (Signed)
Please get the xray.     Go to floor 1 here at [facility]. Go across the glass walk. Make a right at the end. It dead ends into construction. Make a left into the garage and walk to the other end of the garage. Take the elevator down 1 level. That will then connect you to the entrance of the hospital.    Just stop at the front desk and ask where radiology is (it is right behind the front desk).    We will call with the results.    I have sent in the prednisone. This is for both the pain and the inflammation.     I have also printed out more info about the plantar fasciitis.    Please update [place] on Thursday.

## 2020-01-19 NOTE — Telephone Encounter (Signed)
Called patient. Xray shows nothing acute. Some calcaneal spurs. Patient is doing a lot better today. Still tender but now able to walk on foot with a cane. Swelling improving. Continue with prednisone. Still asked for him to give [place] a call tomorrow with an update to see if we need to modify his return to work (currently wrote for 5/21).

## 2020-03-24 ENCOUNTER — Ambulatory Visit: Payer: PRIVATE HEALTH INSURANCE

## 2020-11-13 NOTE — Unmapped (Signed)
Per NT protocols, apt offered and accepted for within 72 hours.    WOP calling to report right foot pain and swelling that started 4 days ago. WOP denies any injury or known cause for pain. Pt is limping at home. Pt also needing to wear a larger shoe. WOP states that swelling is just to the foot and does not go into the leg. See full assessment below.     Future Appointments   Date Time Provider Department Center   11/14/2020  3:00 PM Candie Mile, MD [facility]     Reviewed home care advice attached. Patient/Caregiver verbalized understanding. Patient advised on ER percautions.      Reason for Disposition  ??? MODERATE pain (e.g., interferes with normal activities, limping) and present > 3 days    Answer Assessment - Initial Assessment Questions  1. ONSET: When did the pain start?       11/09/20  2. LOCATION: Where is the pain located?       Right foot   3. PAIN: How bad is the pain?    (Scale 1-10; or mild, moderate, severe)   - MILD (1-3): doesn't interfere with normal activities.    - MODERATE (4-7): interferes with normal activities (e.g., work or school) or awakens from sleep, limping.    - SEVERE (8-10): excruciating pain, unable to do any normal activities, unable to walk.       7/10  4. WORK OR EXERCISE: Has there been any recent work or exercise that involved this part of the body?       Denies   5. CAUSE: What do you think is causing the foot pain?      Unknown   6. OTHER SYMPTOMS: Do you have any other symptoms? (e.g., leg pain, rash, fever, numbness)      Foot swelling, limping  7. PREGNANCY: Is there any chance you are pregnant? When was your last menstrual period?      n/a    Protocols used: FOOT PAIN-A-[place]

## 2020-11-14 ENCOUNTER — Ambulatory Visit: Admit: 2020-11-14 | Discharge: 2020-11-14 | Payer: PRIVATE HEALTH INSURANCE

## 2020-11-14 ENCOUNTER — Ambulatory Visit: Admit: 2020-11-14 | Payer: PRIVATE HEALTH INSURANCE

## 2020-11-14 DIAGNOSIS — R600 Localized edema: Secondary | ICD-10-CM

## 2020-11-14 LAB — URINALYSIS W/RFL TO MICROSCOPIC
Bilirubin, UA: NEGATIVE
Blood, UA: NEGATIVE
Glucose, UA: NEGATIVE mg/dL
Ketones, UA: NEGATIVE mg/dL
Nitrite, UA: NEGATIVE
Protein, UA: NEGATIVE mg/dL
RBC, UA: 2 /HPF (ref 0–3)
Specific Gravity, UA: 1.018 (ref 1.005–1.035)
Squam Epithel, UA: <1 /HPF (ref 0–5)
Urobilinogen, UA: <2 mg/dL (ref 0.2–1.9)
WBC, UA: 5 /HPF (ref 0–5)
pH, UA: 5 (ref 5.0–8.0)

## 2020-11-14 LAB — BASIC METABOLIC PANEL
Anion Gap: 10 mmol/L (ref 3–16)
BUN: 22 mg/dL (ref 7–25)
CO2: 29 mmol/L (ref 21–33)
Calcium: 9.5 mg/dL (ref 8.6–10.3)
Chloride: 103 mmol/L (ref 98–110)
Creatinine: 1.13 mg/dL (ref 0.60–1.30)
EGFR: 77
Glucose: 131 mg/dL — ABNORMAL HIGH (ref 70–100)
Osmolality, Calculated: 299 mosm/kg (ref 278–305)
Potassium: 4.2 mmol/L (ref 3.5–5.3)
Sodium: 142 mmol/L (ref 133–146)

## 2020-11-14 LAB — URIC ACID: Uric Acid: 9 mg/dL (ref 3.8–8.7)

## 2020-11-14 LAB — HEMOGLOBIN A1C: Hemoglobin A1C: 6 % (ref 4.0–5.6)

## 2020-11-14 NOTE — Unmapped (Signed)
History of Present Illness:   Ronald Oconnor is a 55 y.o. male    Pre visit planning has been done.   HPI   Pt with sensitivity of right big toe with increased pain and swelling approx 6 days ago.    then noticed after a few days some lateral foot pain  This has happened in the past and never has definitely had gout diagnosis but treated in the past with that. Did have great uncle have gout. Does not eat a lot of meat, does not have shellfish. 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    He has a past surgical history that includes Eye surgery.    His family history includes COPD in his father; Diabetes in his mother; Glaucoma in his father; Hypertension in his mother and paternal grandmother; Kidney failure in his father.    He reports that he has been smoking cigars. He has smoked for the past 15.00 years. He has never used smokeless tobacco. He reports current alcohol use. He reports that he does not use drugs.      Review of Systems   Respiratory: Negative for shortness of breath.    Cardiovascular: Negative for chest pain.       Allergies:   Patient has no known allergies.    Medications:     Outpatient Encounter Medications as of 11/14/2020   Medication Sig Dispense Refill   ??? predniSONE (DELTASONE) 10 MG tablet Take 4 tabs by mouth daily for 3 days, then 3 tabs daily for 3 days, then 2 tabs daily for 3 days, then 1 tab daily for 3 days, then stop. 30 tablet 0     No facility-administered encounter medications on file as of 11/14/2020.        Objective:       Blood pressure 161/81, pulse 78, temperature 96.8 ??F (36 ??C), temperature source Temporal, height 6' (1.829 m), weight (!) 287 lb (130.2 kg), SpO2 96 %.  144/94  Physical Exam  Vitals and nursing note reviewed.   HENT:      Head: Normocephalic and atraumatic.   Neck:      Thyroid: No thyromegaly.      Vascular: No carotid bruit.    Cardiovascular:      Rate and Rhythm: Normal rate and regular rhythm.      Heart sounds: Normal heart sounds.   Pulmonary:      Effort: Pulmonary effort is normal. No respiratory distress.      Breath sounds: Normal breath sounds. No wheezing or rales.   Musculoskeletal:         General: Tenderness (mild tenderness at MTP) present.      Cervical back: Normal range of motion and neck supple.      Right lower leg: Edema (1+) present.      Left lower leg: Edema (1+) present.   Skin:     General: Skin is warm.   Neurological:      Mental Status: He is alert.            Assessment:    Please see below     Plan:      foot pain--possible gout-check uric acid. Recurring symptoms of right 1st toe pain      Lower ext edema--no signs of heart failure except for edema in legs. Check bmp  elevated blood pressure/htn--discussed with pt DASH--food choices and exercise. 6 week followup    Alcohol misuse screening: performed, negative      Addendum-uric acid-9.0, called x1, na

## 2020-11-14 NOTE — Unmapped (Addendum)
https://www.nhlbi.nih.gov/files/docs/public/heart/dash_brief.pdf>   DASH Eating Plan  DASH stands for Dietary Approaches to Stop Hypertension. The DASH eating plan is a healthy eating plan that has been shown to:  ?? Reduce high blood pressure (hypertension).  ?? Reduce your risk for type 2 diabetes, heart disease, and stroke.  ?? Help with weight loss.  What are tips for following this plan?  Reading food labels  ?? Check food labels for the amount of salt (sodium) per serving. Choose foods with less than 5 percent of the Daily Value of sodium. Generally, foods with less than 300 milligrams (mg) of sodium per serving fit into this eating plan.  ?? To find whole grains, look for the word whole as the first word in the ingredient list.  Shopping  ?? Buy products labeled as low-sodium or no salt added.  ?? Buy fresh foods. Avoid canned foods and pre-made or frozen meals.  Cooking  ?? Avoid adding salt when cooking. Use salt-free seasonings or herbs instead of table salt or sea salt. Check with your health care provider or pharmacist before using salt substitutes.  ?? Do not fry foods. Cook foods using healthy methods such as baking, boiling, grilling, roasting, and broiling instead.  ?? Cook with heart-healthy oils, such as olive, canola, avocado, soybean, or sunflower oil.  Meal planning    ?? Eat a balanced diet that includes:  ? 4 or more servings of fruits and 4 or more servings of vegetables each day. Try to fill one-half of your plate with fruits and vegetables.  ? 6-8 servings of whole grains each day.  ? Less than 6 oz (170 g) of lean meat, poultry, or fish each day. A 3-oz (85-g) serving of meat is about the same size as a deck of cards. One egg equals 1 oz (28 g).  ? 2-3 servings of low-fat dairy each day. One serving is 1 cup (237 mL).  ? 1 serving of nuts, seeds, or beans 5 times each week.  ? 2-3 servings of heart-healthy fats. Healthy fats called omega-3 fatty acids are found in foods such as walnuts,  flaxseeds, fortified milks, and eggs. These fats are also found in cold-water fish, such as sardines, salmon, and mackerel.  ?? Limit how much you eat of:  ? Canned or prepackaged foods.  ? Food that is high in trans fat, such as some fried foods.  ? Food that is high in saturated fat, such as fatty meat.  ? Desserts and other sweets, sugary drinks, and other foods with added sugar.  ? Full-fat dairy products.  ?? Do not salt foods before eating.  ?? Do not eat more than 4 egg yolks a week.  ?? Try to eat at least 2 vegetarian meals a week.  ?? Eat more home-cooked food and less restaurant, buffet, and fast food.  Lifestyle  ?? When eating at a restaurant, ask that your food be prepared with less salt or no salt, if possible.  ?? If you drink alcohol:  ? Limit how much you use to:  ?? 0-1 drink a day for women who are not pregnant.  ?? 0-2 drinks a day for men.  ? Be aware of how much alcohol is in your drink. In the [place] one drink equals one 12 oz bottle of beer (355 mL), one 5 oz glass of wine (148 mL), or one 1?? oz glass of hard liquor (44 mL).  General information  ?? Avoid eating more than 2,300 mg of salt  a day. If you have hypertension, you may need to reduce your sodium intake to 1,500 mg a day.  ?? Work with your health care provider to maintain a healthy body weight or to lose weight. Ask what an ideal weight is for you.  ?? Get at least 30 minutes of exercise that causes your heart to beat faster (aerobic exercise) most days of the week. Activities may include walking, swimming, or biking.  ?? Work with your health care provider or dietitian to adjust your eating plan to your individual calorie needs.  What foods should I eat?  Fruits  All fresh, dried, or frozen fruit. Canned fruit in natural juice (without added sugar).  Vegetables  Fresh or frozen vegetables (raw, steamed, roasted, or grilled). Low-sodium or reduced-sodium tomato and vegetable juice. Low-sodium or reduced-sodium tomato sauce and tomato paste.  Low-sodium or reduced-sodium canned vegetables.  Grains  Whole-grain or whole-wheat bread. Whole-grain or whole-wheat pasta. Brown rice. Orpah Cobb. Bulgur. Whole-grain and low-sodium cereals. Pita bread. Low-fat, low-sodium crackers. Whole-wheat flour tortillas.  Meats and other proteins  Skinless chicken or [place]. Ground chicken or [place]. Pork with fat trimmed off. Fish and seafood. Egg whites. Dried beans, peas, or lentils. Unsalted nuts, nut butters, and seeds. Unsalted canned beans. Lean cuts of beef with fat trimmed off. Low-sodium, lean precooked or cured meat, such as sausages or meat loaves.  Dairy  Low-fat (1%) or fat-free (skim) milk. Reduced-fat, low-fat, or fat-free cheeses. Nonfat, low-sodium ricotta or cottage cheese. Low-fat or nonfat yogurt. Low-fat, low-sodium cheese.  Fats and oils  Soft margarine without trans fats. Vegetable oil. Reduced-fat, low-fat, or light mayonnaise and salad dressings (reduced-sodium). Canola, safflower, olive, avocado, soybean, and sunflower oils. Avocado.  Seasonings and condiments  Herbs. Spices. Seasoning mixes without salt.  Other foods  Unsalted popcorn and pretzels. Fat-free sweets.  The items listed above may not be a complete list of foods and beverages you can eat. Contact a dietitian for more information.  What foods should I avoid?  Fruits  Canned fruit in a light or heavy syrup. Fried fruit. Fruit in cream or butter sauce.  Vegetables  Creamed or fried vegetables. Vegetables in a cheese sauce. Regular canned vegetables (not low-sodium or reduced-sodium). Regular canned tomato sauce and paste (not low-sodium or reduced-sodium). Regular tomato and vegetable juice (not low-sodium or reduced-sodium). Rosita Fire. Olives.  Grains  Baked goods made with fat, such as croissants, muffins, or some breads. Dry pasta or rice meal packs.  Meats and other proteins  Fatty cuts of meat. Ribs. Fried meat. Tomasa Blase. Bologna, salami, and other precooked or cured meats, such as  sausages or meat loaves. Fat from the back of a pig (fatback). Bratwurst. Salted nuts and seeds. Canned beans with added salt. Canned or smoked fish. Whole eggs or egg yolks. Chicken or [place] with skin.  Dairy  Whole or 2% milk, cream, and half-and-half. Whole or full-fat cream cheese. Whole-fat or sweetened yogurt. Full-fat cheese. Nondairy creamers. Whipped toppings. Processed cheese and cheese spreads.  Fats and oils  Butter. Stick margarine. Lard. Shortening. Ghee. Bacon fat. Tropical oils, such as coconut, palm kernel, or palm oil.  Seasonings and condiments  Onion salt, garlic salt, seasoned salt, table salt, and sea salt. Worcestershire sauce. Tartar sauce. Barbecue sauce. Teriyaki sauce. Soy sauce, including reduced-sodium. Steak sauce. Canned and packaged gravies. Fish sauce. Oyster sauce. Cocktail sauce. Store-bought horseradish. Ketchup. Mustard. Meat flavorings and tenderizers. Bouillon cubes. Hot sauces. Pre-made or packaged marinades. Pre-made or packaged taco seasonings.  Relishes. Regular salad dressings.  Other foods  Salted popcorn and pretzels.  The items listed above may not be a complete list of foods and beverages you should avoid. Contact a dietitian for more information.  Where to find more information  ?? National Heart, Lung, and Blood Institute: PopSteam.is  ?? American Heart Association: www.heart.org  ?? Academy of Nutrition and Dietetics: www.eatright.org  ?? National Kidney Foundation: www.kidney.org  Summary  ?? The DASH eating plan is a healthy eating plan that has been shown to reduce high blood pressure (hypertension). It may also reduce your risk for type 2 diabetes, heart disease, and stroke.  ?? When on the DASH eating plan, aim to eat more fresh fruits and vegetables, whole grains, lean proteins, low-fat dairy, and heart-healthy fats.  ?? With the DASH eating plan, you should limit salt (sodium) intake to 2,300 mg a day. If you have hypertension, you may need to reduce your  sodium intake to 1,500 mg a day.  ?? Work with your health care provider or dietitian to adjust your eating plan to your individual calorie needs.  This information is not intended to replace advice given to you by your health care provider.

## 2020-12-18 NOTE — Unmapped (Signed)
Letter mailed regarding test results. Date 12/18/2020. Completed paperwork and faxed to number given then scanned to medical records Ronald Oconnor 12/18/2020 1:38 PM

## 2021-01-02 ENCOUNTER — Ambulatory Visit: Payer: PRIVATE HEALTH INSURANCE

## 2021-01-02 NOTE — Unmapped (Deleted)
History of Present Illness:   Ronald Oconnor is a 55 y.o. male  Pre visit planning has been done.     HPI   Gout,htn  bp followup     Health Maintenance Due   Topic Date Due   ??? Immunization: Pneumococcal (1 of 2 - PPSV23) Never done   ??? Colorectal Cancer Screening (MyChart)  Never done   ??? Lung Cancer Screening  Never done   ??? Immunization: Zoster (2 of 2) 10/27/2019   ??? Tobacco Cessation Readiness  12/23/2020   ??? Comprehensive Physical Exam  12/23/2020       Histories:     He has no past medical history on file.    He has a past surgical history that includes Eye surgery.    His family history includes COPD in his father; Diabetes in his mother; Glaucoma in his father; Hypertension in his mother and paternal grandmother; Kidney failure in his father.    He reports that he has been smoking cigars. He has smoked for the past 15.00 years. He has never used smokeless tobacco. He reports current alcohol use. He reports that he does not use drugs.      Review of Systems    Allergies:   Patient has no known allergies.    Medications:     No outpatient encounter medications on file as of 01/02/2021.     No facility-administered encounter medications on file as of 01/02/2021.        Objective:       There were no vitals taken for this visit.    Physical Exam       Assessment:    ***    Plan:   ***

## 2021-07-22 NOTE — Unmapped (Signed)
DR CALLED BACK ADVISED THIS IS SOMETHING FOR OFFICE HOURS CALLED AND ADVISED PT

## 2021-07-22 NOTE — Unmapped (Addendum)
MRN: [id number]   Specialty: Mackie Pai    Patient Name: Ronald Oconnor     Patient Date of Birth: 1965-12-01     Relationship of Caller to Patient and Callback: ALVESTER EADS [phone]    Patient of: DR Scarlette Calico of Call: STATES 2 OF PATIENTS FINGERS HAS BEEN SWOLLEN AND TENDER FOR A MONTH REQUEST TO SPEAK TO ON CALL DR    On Call Provider Contacted: DR COBERLY LMVM    Time and Method of Contact:     Advise Caller: If provider does not call back within 30 minutes, please call us back.    ROUTE TELEPHONE NOTE - Follow Qgenda and/or Route Directly to Provider.

## 2021-07-23 ENCOUNTER — Encounter

## 2021-07-23 NOTE — Progress Notes (Signed)
Formatting of this note is different from the original.  Images from the original note were not included.  Primary Care Provider:   Rebbeca Paul MD  [facility] [address]  [place] [address]    Subjective   Subjective:    Marcus Castro is a 55 year old male who presents today with   Chief Complaint   Patient presents with   ? hand swelling     HPI   Patient is here today because of right hand and finger swelling. He said he first noticed it about 3 weeks ago when he returned from [place]. He said the swelling has improved in the hand but his middle and ring finger of his right hand are still swollen and he has limited ROM because of it. He had no known injury and no known insect bite. He said the hand aches, periodically, as well. He has tried ibuprofen and OTC arthritis cream with some relief. He has not had any symptoms like this before. He has had a gout outbreak in the past and bursitis in his right elbow, but nothing like these symptoms per report.     Patient has already received a flu vaccine this season.    Past Medical History   He  has a past medical history of Vitamin D deficiency.    Past Surgical History   He  has a past surgical history that includes Anterior cruciate ligament repair (Right) and Wrist surgery (Left, 2000).    Social History   Social History     Tobacco Use   ? Smoking status: Light Smoker     Types: Cigars   ? Smokeless tobacco: Never   ? Tobacco comments:     2 Cigars a week   Substance Use Topics   ? Alcohol use: Yes     Comment: Socially     Family History   He family history includes Cancer in his father; Diabetes in his maternal aunt, maternal cousin, and paternal cousin; High BP in his mother.    Current Medication     Outpatient Medications Marked as Taking for the 07/23/21 encounter (Office Visit) with [facility]:   ?  ibuprofen (ADVIL,MOTRIN) 200 mg tablet, Take as needed and as directed per manufacturer's instructions., Disp: , Rfl: Last reviewed on  07/23/2021 10:43 AM by Owens Loffler, CNP  Note: Some of the medications listed above may have been ordered after the last review.    Allergies   No Known Allergies    Review of Systems   Constitutional: Negative.  Negative for activity change, appetite change, chills, diaphoresis, fatigue and fever.   Respiratory: Negative.  Negative for cough, shortness of breath and wheezing.    Cardiovascular: Negative.  Negative for chest pain and palpitations.   Musculoskeletal: Positive for joint swelling (right hand and fingers).   Skin: Negative.  Negative for color change, rash and wound.     BP 130/90   Pulse 84   Temp 98.3 F (36.8 C)   Resp 16   SpO2 98%     Objective   Objective:  Physical Exam  Constitutional:       General: He is not in acute distress.     Appearance: Normal appearance. He is well-developed. He is not ill-appearing.   Cardiovascular:      Rate and Rhythm: Normal rate and regular rhythm.      Heart sounds: S1 normal and S2 normal.   Pulmonary:  Effort: Pulmonary effort is normal. No respiratory distress.      Breath sounds: Normal breath sounds and air entry. No decreased breath sounds, wheezing, rhonchi or rales.   Musculoskeletal:      Right hand: Swelling (significant swelling of 3rd and 4th digits) and tenderness present. No deformity, lacerations or bony tenderness. Decreased range of motion (decreased ROM with flexion of 3rd and 4th digits). Normal sensation. Normal capillary refill. Normal pulse.   Skin:     General: Skin is warm and dry.      Findings: No rash.     Procedures        Assessment & Plan:    [name] was seen today for hand swelling.    Diagnoses and all orders for this visit:    Swelling of finger of right hand  -     XR HAND RIGHT PA LATERAL AND OBLIQUE; Future  -     ibuprofen (ADVIL,MOTRIN) 200 mg tablet; Take as needed and as directed per manufacturer's instructions.    Hand pain, right  -     XR HAND RIGHT PA LATERAL AND OBLIQUE; Future  -     ibuprofen  (ADVIL,MOTRIN) 200 mg tablet; Take as needed and as directed per manufacturer's instructions.    Elevated BP without diagnosis of hypertension    Patient Instructions   Follow-up with PCP and orthopedic for further evaluation.    Monitor blood pressure and Follow-up with primary care provider if remaining >120/>80.    If you experience severe symptoms or need immediate assistance, please call 911 or go to the emergency room.     For non-urgent questions or concerns regarding your condition or visit, please submit a message to the [facility] at [facility] location through [facility] within seven days of your visit.      If you do not have a MyChart account, please call [phone] for non-urgent questions or concerns.     Thank you for visiting [facility].     Clinical References          Hand Pain    About this topic   The hand is made up of many small bones. Cartilage covers the ends of the bones to help the joints glide easier. Ligaments are strong bands of tissue that hold your bones together. There are also some muscles and tendons in your hand. These attach to the bones and help move the hand up, down, or sideways. Nerves and blood vessels also run through your hand. There are layers of connective tissue in your hand. The skin on your palm is very thick. Damage or injury to any of these structures can lead to hand pain and problems.    What are the causes?    Trauma from a fall, direct hit, accident, or fight   Hand is crushed, twisted, pulled, or jammed   Accident that causes burns or cuts   Arthritis   Infection   Irritation to muscles, ligaments, or other tissues   Nerve compression   Circulation problems  What can make this more likely to happen?    Having a job where your hands are at risk for injury   Having a job where you do repeat motions with your hands   Playing contact sports or sports where you are at risk for falling   Having certain conditions such as rheumatoid  arthritis and diabetes   Getting certain cancer drugs  What are the main signs?  Pain, swelling, soreness, or stiffness   Bruising or bleeding   Trouble bending, straightening, or moving the hand   Numbness, tingling, prickling, or burning sensations   Weakness or trouble gripping objects   Hand or fingers are cold and pale   Hand in an abnormal position  How does the doctor diagnose this health problem?   Your doctor will look at your hand. Your doctor also may look at your arm, shoulder, and neck. Your doctor may have you try to move your fingers to check your motion. Your doctor may push on your arm, hand, and fingers to test your strength. Your doctor will also check for numbness and blood flow.   X-ray   MRI or CT scan   Nerve conduction test   Electromyelogram (EMG) ? to look at how well the nerves are working  How does the doctor treat this health problem?   Based on the problem, your doctor may suggest:   Rest and avoid activities that make your problem worse   Ice   Elevation   Compression   Splint, brace, or cast   Taping   Wound care   Heat   Exercises   Massage   Acupuncture   Surgery  What drugs may be needed?   The doctor may order drugs to:   Help with pain and swelling, like ibuprofen (Advil, Motrin). These are nonsteroidal anti-inflammatory drugs (NSAIDS).    Help with pain, such as acetaminophen (Tylenol)    Prevent or fight an infection   Treat a skin problem  The doctor may give you a shot of an anti-inflammatory drug called a corticosteroid. This will help with swelling. Talk with your doctor about the risks of this shot.  What problems could happen?    Infection   Loss of motion   Loss of finger movement or strength   Ongoing pain or stiffness    Long-term disability   Injury to nerves, blood vessels, or other tissues    Poor healing  What can be done to prevent this health problem?    Take rests often when doing something with repeat hand motions. Shake  out your hands or rub them during breaks.   Alternate between activities or tasks using repeat hand motions if possible.   Do not keep your hand in the same position for long periods of time.   When picking up heavy objects, use both hands together. Keep your wrists straight.   Keep your fingers and hand moving, especially if you have arthritis. Not moving can cause stiffness and pain.   Wear protective equipment when playing sports.   Follow all safety precautions when running machinery.   Do not run equipment or machines when tired.   Do not wear rings when working with machinery.   Use caution when cutting with knives. Make sure the blades stay sharp. Dull blades can slip and cause injuries.   Do not approach fighting dogs or animals. Be careful when getting near an animal that you do not know.  Where can I learn more?   American Academy of Orthopaedic Surgeons  http://orthoinfo.aaos.org/menus/hand.cfm   Last Reviewed Date   2014-02-14  Consumer Information Use and Disclaimer   This information is not specific medical advice and does not replace information you receive from your health care provider. This is only a brief summary of general information. It does NOT include all information about conditions, illnesses, injuries, tests, procedures, treatments, therapies, discharge instructions or life-style choices that  may apply to you. You must talk with your health care provider for complete information about your health and treatment options. This information should not be used to decide whether or not to accept your health care provider?s advice, instructions or recommendations. Only your health care provider has the knowledge and training to provide advice that is right for you.  Copyright   Copyright  2017 [place] and its affiliates and/or licensors. All rights reserved.        Blood Pressure Testing and Measurement    Why is this procedure done?   Your heart  pumps blood through your body. Blood pressure measures the pressure in your arteries when the heart beats. Your blood pressure has two numbers. The top number is the systolic number. It measures the highest amount of pressure in the artery when the heart is beating. The second number or bottom number is the diastolic number. It is the lowest pressure in the artery when the heart is resting.  Blood pressure is sometimes shortened to BP. A blood pressure measurement is taken using a special tool that may be referred to as a blood pressure cuff. You may have your BP checked:   As part of a routine check-up to give your doctor a record of your BP   To screen for high blood pressure   For your doctor to see how well your drugs are working    What will the results be?   You can ask for the results of your BP measurement. Ask your doctor about the measurement results and what they mean.  What happens before the procedure?    Avoid drinking fluids high in caffeine, like coffee or carbonated drinks, 30 minutes before the procedure.   Tell your doctor if you are taking any drugs. Some drugs can affect your blood pressure.   Go to the bathroom and empty your bladder before the test.   Your doctor may ask you to rest for 5 minutes before having your blood pressure checked. Activities like running, jumping, or even walking may raise your heartbeat and may give a false result.   While having the test, try not to talk or move. This may give a false-positive reading.   Wear a shirt that will allow access to your upper arm.  What happens during the procedure?    For this test, you will sit with your back supported and your legs uncrossed. You may be asked to rest your arm on a table at heart level.   The BP cuff will be wrapped snugly around your upper arm. The person taking your BP will place a stethoscope in the crease of your elbow to listen for your pulse.   The cuff will be inflated quickly and you will feel it  squeezing your upper arm. The staff person will listen to your pulse and note the first sound while looking at the dial of the BP measuring tool. They will continue to let air out of the BP cuff while listening to your heartbeat and take note of the last sound.   All the air is let out of the cuff after the last sound is heard. Your BP is written as the number heard at the first sound "over" the number heard at the second.   Sometimes, a machine is used to check your blood pressure. The procedure is the same. The machine "listens" for your pulse instead of a person.   Most often,  blood pressure measurement will take 1 to 2 minutes.  What happens after the procedure?    You may go home right away. Your doctor will tell you if you need more tests.   The results will help your doctor understand if you have a problem with your blood pressure. Together you can make a plan for more care.  What care is needed at home?    Your doctor may find you have a problem with your blood pressure. Then you may need to check your blood pressure at home. If so, you will learn how to do this. Be sure to keep a record of every blood pressure reading.   Your doctor may give you drugs for high blood pressure. Take your drugs as ordered.  What follow-up care is needed?    Your doctor may ask you to make visits to the office to check on your progress. Be sure to keep these visits.   Your doctor may ask for other tests if you have low or high blood pressure. This will help to decide the best treatment plan for you.  What lifestyle changes are needed?    Keep a normal weight. If you are too heavy, lose weight.   Keep cholesterol and blood sugar under control.   If you smoke, stop smoking. Smoking can increase your blood pressure.   Exercise regularly. A 30-minute workout each day can help control your blood pressure, manage your weight, and strengthen your heart.  What problems could happen?   High blood pressure can lead to heart  attack or stroke.  Helpful tips    You should have a blood pressure test at least once every 2 years to screen for high blood pressure. This should start at age 35.   If you are diagnosed with high blood pressure, you should have blood pressure tests more often.  Where can I learn more?   American Heart Association  http://www.pena.net/.jsp   Better Health Channel  MarketingSpree.dk.nsf/pages/Blood_pressure_explained   FamilyDoctor.org  http://familydoctor.org/familydoctor/en/diseases-conditions/high-blood-pressure/diagnosis-tests/blood-pressure-monitoring-at-home.html   National Heart Lung and Blood Institute ? Your Guide to Lowering High Blood Pressure  http://fox-wallace.com/   Last Reviewed Date   2014-08-02  Consumer Information Use and Disclaimer   This information is not specific medical advice and does not replace information you receive from your health care provider. This is only a brief summary of general information. It does NOT include all information about conditions, illnesses, injuries, tests, procedures, treatments, therapies, discharge instructions or life-style choices that may apply to you. You must talk with your health care provider for complete information about your health and treatment options. This information should not be used to decide whether or not to accept your health care provider?s advice, instructions or recommendations. Only your health care provider has the knowledge and training to provide advice that is right for you.  Copyright   Copyright  2017 [place] and its affiliates and/or licensors. All rights reserved.        Orders Placed This Encounter   ? XR HAND RIGHT PA LATERAL AND OBLIQUE     Standing Status:   Future     Standing Expiration Date:   07/23/2022     Order  Specific Question:   MyChart Release Results     Answer:   Immediate     Order Specific Question:   Reason for Exam:     Answer:   pain and swelling of right hand and fingers   ?  ibuprofen (ADVIL,MOTRIN) 200 mg tablet     Sig: Take as needed and as directed per manufacturer's instructions.     Electronically signed by Owens Loffler, CNP at 07/23/2021 10:57 AM EST

## 2021-07-24 NOTE — Unmapped (Signed)
Pt wife called in with Pt on the line regarding 11/20 encounter and swollen hand and fingers.     Pt advised he went to [facility] clinic yesterday and completed an xray but nothing significant was found, no broken bones, some arthritis.    Pt reports some pain when bending fingers but able to still use hand and asked if there is anything MD can do to get the swelling down. Pt states he is taking ibuprofen and is not working    Pt requests return call at [phone]

## 2021-07-24 NOTE — Unmapped (Signed)
Spoke with pt, they are ok to come in and get looked at , pt leaves this Friday to go to [place] and will not be back until 08/20/21, ok to bring in with a partner or do you want do a video/call ,

## 2021-07-25 NOTE — Unmapped (Signed)
Called pt, offered same day appt per pcp on Friday. Pt states he is flying to [place] on Friday at 7am. Will not be able to make it to appt. Advised use ibuprofen for pain and swelling. Be seen at urgent care if symptoms worsen. Pt voices understanding.

## 2021-08-07 NOTE — Unmapped (Signed)
Oaklyn Mans wife called in requesting an appt for the week of the 18th, as pt continues to have hand and finger swelling. Pt is NOT in the states of [place] at this time and is currently in [place] working.  Per wife pt noticed on Sunday his ankles were both swollen as well as his hands and fingers.     Wife is not with the patient at this time, and is not able to answer all NT questions.     This RN did advised that given worsening swelling with new ankle swelling this RN would advise seeking an ER closest to the patient.WOP voiced understanding.     Please call pt back at: [phone]

## 2021-08-07 NOTE — Unmapped (Signed)
Spoke with pt , per pt Right hand and finger swelling last two digits started about a month ago, per pt has been on going since then  , today a little better, pt st that he noticed that his Right foot was also swelling this just started this week,   Per pt there has been no trauma or injury to cause this  Pt denies :  SOB  Chest pain  numbness or tingling in any extremity   HA  Vision disturbances   Pt was advise since he was oust of town to go to ED or urgent care to be evaluated , also appt made for when he is town to see PCP

## 2021-08-24 ENCOUNTER — Ambulatory Visit: Admit: 2021-08-24 | Discharge: 2021-08-28 | Payer: PRIVATE HEALTH INSURANCE

## 2021-08-24 DIAGNOSIS — I10 Essential (primary) hypertension: Secondary | ICD-10-CM

## 2021-08-24 MED ORDER — furosemide (LASIX) 20 MG tablet
20 | ORAL_TABLET | Freq: Every day | ORAL | 1 refills | Status: AC
Start: 2021-08-24 — End: ?

## 2021-08-24 MED ORDER — valsartan (DIOVAN) 40 MG tablet
40 | ORAL_TABLET | Freq: Every day | ORAL | 1 refills | Status: AC
Start: 2021-08-24 — End: ?

## 2021-08-24 NOTE — Unmapped (Signed)
History of Present Illness:   Ronald Oconnor is a 55 y.o. male    Pre visit planning has been done.     HPI   htn,gout   right hand pain and swelling of 3rd,4th fingers. Feet began swelling after that.;pt with achiness,soreness in feet, went to [place] for a few weeks. Pt reports given furosemide and some improvement but only 10 day course.of it and has completed.  Health Maintenance Due   Topic Date Due   ??? Immunization: Pneumococcal (1 - PCV) Never done   ??? Colorectal Cancer Screening (MyChart)  Never done   ??? Lung Cancer Screening  Never done   ??? Immunization: Zoster (2 of 2) 10/27/2019   ??? Immunization: COVID-19 (4 - Booster) 10/25/2020   ??? Tobacco Cessation Readiness  12/23/2020   ??? Comprehensive Physical Exam  12/23/2020   ??? Depression Screening  01/17/2021   ??? Discuss PSA Screening  03/13/2021   ??? Immunization: Influenza (MyChart) (1) 05/03/2021       Histories:     He has no past medical history on file.    He has a past surgical history that includes Eye surgery.    His family history includes COPD in his father; Diabetes in his mother; Glaucoma in his father; Hypertension in his mother and paternal grandmother; Kidney failure in his father.    He reports that he has been smoking cigars. He has never used smokeless tobacco. He reports current alcohol use. He reports that he does not use drugs.      Review of Systems    Allergies:   Patient has no known allergies.    Medications:     No outpatient encounter medications on file as of 08/24/2021.     No facility-administered encounter medications on file as of 08/24/2021.        Objective:         Vitals:    08/24/21 1546   BP: (!) 158/94   Pulse: 74   Resp: 16     Physical Exam  Vitals and nursing note reviewed.   Constitutional:       Appearance: He is well-nourished.   HENT:      Head: Normocephalic and atraumatic.   Neck:      Thyroid: No thyromegaly.      Vascular: No carotid bruit.   Cardiovascular:      Rate and Rhythm: Normal rate and regular rhythm.       Heart sounds: Normal heart sounds.   Pulmonary:      Effort: Pulmonary effort is normal. No respiratory distress.      Breath sounds: Normal breath sounds. No wheezing or rales.   Musculoskeletal:      Cervical back: Normal range of motion and neck supple.      Right lower leg: Edema (ankle) present.      Left lower leg: Edema (ankle) present.   Lymphadenopathy:      Cervical: No cervical adenopathy.   Skin:     General: Skin is warm.   Neurological:      Mental Status: He is alert.   Psychiatric:         Behavior: Behavior normal.            Assessment:      Please see below   Plan:   Edema-uncertain cause. Could be elevated blood pressure. No signs of heart failure otherwise    htn--persistent elevated blood pressure. Avoiding thiazide due to diuretic. Try furosemide 20mg   daily with valsartan 40mg    bmp check in 1 week . Check bp at home  Contact pt in 2-3 weeks about bp    Has covid booster and fluvax in [place]  Pt rotates between here and [place] so followup not scheduled but will evaluate after seeing home bp readings    Lower ext edema-he was told his labwork at urgent care was normal. No symptoms of heart failure. Arrange for bloodwork next week after valsartan started

## 2021-08-24 NOTE — Unmapped (Signed)
Remember to get blood drawn in 1 week

## 2022-05-13 ENCOUNTER — Ambulatory Visit: Admit: 2022-05-13 | Payer: PRIVATE HEALTH INSURANCE

## 2022-05-13 DIAGNOSIS — M6281 Muscle weakness (generalized): Secondary | ICD-10-CM

## 2022-05-13 MED ORDER — amLODIPine (NORVASC) 5 MG tablet
5 | ORAL_TABLET | Freq: Every day | ORAL | 2 refills | Status: AC
Start: 2022-05-13 — End: ?

## 2022-05-13 NOTE — Unmapped (Signed)
Per NT protocols, same day apt offered and accepted by patient/caregiver.    Pt calling to report R hand/arm numbness and tingling.     See full and complete assessment below     Future Appointments   Date Time Provider Department Center   05/13/2022  4:20 PM Candie Mile, MD [facility]   09/09/2022  1:40 PM Candie Mile, MD [facility]       Appointment date, time and provider information given to patient/caregiver. Voiced understanding.    Patient/Caregiver advised to arrive 15 minutes early for check in process.   Advised to call with any cancellation or rescheduling needs.      Reviewed home care advice attached. Patient/Caregiver verbalized understanding. Patient advised on ER percautions.      Reason for Disposition   Tingling (e.g., pins and needles) of the face, arm or leg on one side of the body, that is present now (Exceptions: Chronic or recurrent symptom lasting > 4 weeks; or tingling from known cause, such as: bumped elbow, carpal tunnel syndrome, pinched nerve, frostbite.)    Answer Assessment - Initial Assessment Questions  1. SYMPTOM: What is the main symptom you are concerned about? (e.g., weakness, numbness)      Numbness/Tingling     2. ONSET: When did this start? (minutes, hours, days; while sleeping)      1 week ago     3. LAST NORMAL: When was the last time you (the patient) were normal (no symptoms)?      *No Answer*  4. PATTERN Does this come and go, or has it been constant since it started?  Is it present now?      Comes and goes- At this time has a sleepy feeling over the thumb and index finger of the R hand.     Lasts for a few hours     5. CARDIAC SYMPTOMS: Have you had any of the following symptoms: chest pain, difficulty breathing, palpitations?      Denies     6. NEUROLOGIC SYMPTOMS: Have you had any of the following symptoms: headache, dizziness, vision loss, double vision, changes in speech, unsteady on your feet?      Denies     7. OTHER SYMPTOMS: Do you  have any other symptoms?      Muscle weakness-loss of strength     No swelling   No rash   No fever   No falls or injuries   No medication changes   No diet changes   No medical changes     8. PREGNANCY: Is there any chance you are pregnant? When was your last menstrual period?      N/a    Protocols used: Neurologic [facility]

## 2022-05-13 NOTE — Unmapped (Signed)
Subjective   History of Present Illness:   Ronald Oconnor is a 56 y.o. male    Pre visit planning has been done.     HPI   Pt with right hand numbness/tingling near dorsal thumb extends toward elbow to 1st and 2nd fingers.  Occurs with exercise using right arm.  Right arm weakness, esp involving bicep. Does not think that he could curl 10lbs.  Denies any injury to area.      Histories:     He has no past medical history on file.    He has a past surgical history that includes Eye surgery.    His family history includes COPD in his father; Diabetes in his mother; Glaucoma in his father; Hypertension in his mother and paternal grandmother; Kidney failure in his father.    He reports that he has been smoking cigars. He has never used smokeless tobacco. He reports current alcohol use. He reports that he does not use drugs.      Review of Systems    Allergies:   Patient has no known allergies.    Medications:     Outpatient Encounter Medications as of 05/13/2022   Medication Sig Dispense Refill    allopurinoL (ZYLOPRIM) 300 MG tablet Take 1 tablet (300 mg total) by mouth in the morning and at bedtime.      furosemide (LASIX) 20 MG tablet Take 1 tablet (20 mg total) by mouth daily. 30 tablet 1    meloxicam (MOBIC) 15 MG tablet Take 1 tablet (15 mg total) by mouth daily.      valsartan (DIOVAN) 40 MG tablet Take 1 tablet (40 mg total) by mouth daily. 30 tablet 1     No facility-administered encounter medications on file as of 05/13/2022.       Objective   Blood pressure (!) 160/91, pulse 77, temperature 97.8 F (36.6 C), temperature source Temporal, weight (!) 297 lb 1.6 oz (134.8 kg), SpO2 97 %.    Physical Exam  Vitals and nursing note reviewed.   HENT:      Head: Normocephalic and atraumatic.   Musculoskeletal:      Comments: Bicep tendon intact   Neurological:      Mental Status: He is alert.      Motor: Weakness (bicep 3/5) present.      Comments: 5/5 right tricep  Left bicep and tricep  Handgrip intact        Assessment     Please see below    Plan     Bicep atrophy/muscle weakness-concern for nerve impingement, could be brachial plexus injury vs cervical since has numbness at right thumb.  Initially concerned about impingement at elbow but that does not explain bicep weakness  Emg to find source    Htn--pt off of medication and bp is up. Would like to add amlodipine 5mg  daily    Prediabetes--check a1c       Rtc-5 weeks for followup

## 2022-05-15 ENCOUNTER — Ambulatory Visit: Admit: 2022-05-15 | Discharge: 2022-05-15 | Payer: PRIVATE HEALTH INSURANCE

## 2022-05-15 DIAGNOSIS — G5611 Other lesions of median nerve, right upper limb: Secondary | ICD-10-CM

## 2022-05-15 DIAGNOSIS — M5412 Radiculopathy, cervical region: Secondary | ICD-10-CM

## 2022-05-15 DIAGNOSIS — M6281 Muscle weakness (generalized): Secondary | ICD-10-CM

## 2022-05-15 NOTE — Unmapped (Signed)
[facility]  [address]  [place], [place] [id number]  Phone: [phone]    Fax: [phone]    Test Date:  05/15/2022    Patient: Ronald Oconnor Ruddy DOB: May 07, 1966 Physician: Janalyn Rouseanielle Jeniel Slauson, MD   Sex: Male Height: 6' 0 Ref Phys: Nyra JabsEstralita Dixon, MD   ID#: [id number] Weight: 297 lbs. Technician:      Patient Complaints: Right arm weakness      Patient History:  Ronald Oconnor Langlinais is a 56 y.o. male with 2 weeks of numbness/tingling in the right thumb and lateral forearm as well as right biceps and shoulder weakness.  Denies acute trauma or injury.  Denies neck pain or radiating pain.  Here for further assessment with EMG.      Reviewed EMG examination with patient including risks and benefits of procedure and expectations during and post EMG. Patient expressed understanding and exam initiated.    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  There is evidence of slowing on NSC.  No evidence of axonal loss or acute denervation on needle EMG  There is no evidence of right ulnar neuropathy across the elbow.  There is evidence of a subacute-chronic right C6 radiculopathy.  There is evidence of acute denervation on needle EMG as well as reinnervation.      Recommendations:   -Follow up with referring provider.   Consider MRI-C spine for further assessment of radiculopathy        ___________________________  Janalyn Rouseanielle Amara Manalang, MD        Nerve Conduction Studies  Anti Sensory Summary Table     Stim Site NR Peak (ms) Norm Peak (ms) P-T Amp (V) Norm P-T Amp Site1 Site2 Delta-P (ms) Dist (cm) Vel (m/s) Norm Vel (m/s)   Right Median Anti Sensory (2nd Digit)   Wrist    4.2 <3.6 4.9 >19 Wrist 2nd Digit 4.2 14.0 33 >39   Right Radial Anti Sensory (Base 1st Digit)   Wrist    2.6 <3.1 13.2  Wrist Base 1st Digit 2.6 0.0     Right Ulnar Anti Sensory (5th Digit)   Wrist    2.9 <3.7 6.5 >15.0 Wrist 5th Digit 2.9 14.0 48 >38     Motor Summary Table      Stim Site NR Onset (ms) Norm Onset (ms) O-P Amp (mV) Norm O-P Amp Site1 Site2 Delta-0 (ms) Dist (cm) Vel (m/s) Norm Vel (m/s)   Right Median Motor (Abd Poll Brev)   Wrist    4.4 <4.2 13.7 >5 Elbow Wrist 4.8 25.0 52 >50   Elbow    9.2  11.8          Right Ulnar Motor (Abd Dig Minimi)   Wrist    2.9 <4.2 10.3 >3 B Elbow Wrist 4.8 23.5 49 >49   B Elbow    7.7  9.2  A Elbow B Elbow 1.8 10.5 58 >49   A Elbow    9.5  8.2            F Wave Studies     NR F-Lat (ms) Lat Norm (ms) L-R F-Lat (ms) L-R Lat Norm   Right Median (Mrkrs) (Abd Poll Brev)      34.49 <34.2  <2.2     EMG     Side Muscle Nerve Root Ins Act Fibs Psw Amp Dur Poly Recrt Int Pat Comment   Right Deltoid Axillary C5-6 Incr 1+ 1+ Incr >  12ms 0 Reduced    Right Biceps Musculocut C5-6 Incr 1+ 1+ Incr >60ms 1+ Reduced 25%    Right Triceps Radial C6-7-8 Nml Nml Nml Nml Nml 0 Nml Nml    Right PronatorTeres Median C6-7 Nml Nml Nml Incr >72ms 0 Reduced 50%    Right BrachioRad Radial C5-6 Nml Nml Nml Nml Nml 0 Nml Nml    Right 1stDorInt Ulnar C8-T1 Nml Nml Nml Nml Nml 0 Nml Nml    Right Abd Poll Brev Median C8-T1 Nml Nml Nml Nml Nml 0 Nml Nml      Paraspinal EMG     Side Muscle Nerve Root Ins Act Fibs Psw   Right C5 Parasp Rami C5 Nml Nml Nml   Right C6 Parasp Rami C6 Incr 1+ 2+           Waveforms:

## 2022-05-16 ENCOUNTER — Ambulatory Visit: Admit: 2022-05-16 | Payer: PRIVATE HEALTH INSURANCE

## 2022-05-16 DIAGNOSIS — Z0000 Encounter for general adult medical examination without abnormal findings: Secondary | ICD-10-CM

## 2022-05-16 LAB — HEMOGLOBIN A1C
Hemoglobin A1C: 6.2 %
Hemoglobin A1C: 6.2 % — ABNORMAL HIGH (ref 4.0–5.6)

## 2022-05-16 LAB — COMPREHENSIVE METABOLIC PANEL, SERUM
ALT: 31 U/L (ref 7–52)
AST (SGOT): 31 U/L (ref 13–39)
Albumin: 4.3 g/dL (ref 3.5–5.7)
Alkaline Phosphatase: 67 U/L (ref 36–125)
Anion Gap: 8 mmol/L (ref 3–16)
BUN: 16 mg/dL (ref 7–25)
CO2: 27 mmol/L (ref 21–33)
Calcium: 9.4 mg/dL (ref 8.6–10.3)
Chloride: 105 mmol/L (ref 98–110)
Creatinine: 1.01 mg/dL (ref 0.60–1.30)
EGFR: 87
Glucose: 102 mg/dL — ABNORMAL HIGH (ref 70–100)
Osmolality, Calculated: 291 mosm/kg (ref 278–305)
Potassium: 4.5 mmol/L (ref 3.5–5.3)
Sodium: 140 mmol/L (ref 133–146)
Total Bilirubin: 0.6 mg/dL (ref 0.0–1.5)
Total Protein: 7 g/dL (ref 6.4–8.9)

## 2022-05-16 LAB — LIPID PANEL
Cholesterol, Total: 167 mg/dL (ref 0–200)
HDL: 48 mg/dL (ref 60–92)
LDL Cholesterol: 103 mg/dL
Non-HDL Cholesterol, Calculated: 119 mg/dL (ref 0–129)
Triglycerides: 80 mg/dL (ref 10–149)

## 2022-05-16 NOTE — Unmapped (Signed)
Done as requested.     Recipients Sent On Sent By Routed Reports    Jessica with dr Molly Maduroobert    05/16/2022  2:23 PM Blair HaileyJulie M Rece Zechman, LPN

## 2022-05-16 NOTE — Unmapped (Signed)
Ronald Oconnor with Ronald Maduroobert MD's office requesting that Pt's uric acid lab results be faxed.    Results can be faxed to  [phone]    Ronald Oconnor can be reached at  [phone]

## 2022-05-23 NOTE — Unmapped (Signed)
Spoke with pt , these meds are not in pt med list , per pt his podiatrist is the one that RX this , suggested to pt to call them and request refill , any problem or concern to all office bak

## 2022-06-11 ENCOUNTER — Ambulatory Visit: Payer: PRIVATE HEALTH INSURANCE

## 2022-06-11 NOTE — Unmapped (Deleted)
Subjective   History of Present Illness:   Ronald Oconnor is a 56 y.o. male    Pre visit planning has been done.     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  There is evidence of slowing on NSC.  No evidence of axonal loss or acute denervation on needle EMG  There is no evidence of right ulnar neuropathy across the elbow.  There is evidence of a subacute-chronic right C6 radiculopathy.  There is evidence of acute denervation on needle EMG as well as reinnervation.    Health Maintenance Due   Topic Date Due    Immunization: Hepatitis B (1 of 3 - 3-dose series) Never done    Immunization: Pneumococcal (1 - PCV) Never done    Colorectal Cancer Screening (MyChart)  Never done    Lung Cancer Screening  Never done    Immunization: Zoster (2 of 2) 10/27/2019    Immunization: COVID-19 (4 - Pfizer series) 10/25/2020    Comprehensive Physical Exam  12/23/2020    Depression Screening  01/17/2021    Discuss PSA Screening  03/13/2021    Alcohol Misuse Screening  11/14/2021    Immunization: Influenza (MyChart) (1) 05/03/2022       Histories:     He has no past medical history on file.    He has a past surgical history that includes Eye surgery.    His family history includes COPD in his father; Diabetes in his mother; Glaucoma in his father; Hypertension in his mother and paternal grandmother; Kidney failure in his father.    He reports that he has been smoking cigars. He has never used smokeless tobacco. He reports current alcohol use. He reports that he does not use drugs.      Review of Systems    Allergies:   Patient has no known allergies.    Medications:     Outpatient Encounter Medications as of 06/11/2022   Medication Sig Dispense Refill    allopurinoL (ZYLOPRIM) 100 MG tablet Take 1 tablet (100 mg total) by mouth daily.      amLODIPine (NORVASC) 5 MG tablet Take 1 tablet (5 mg total) by mouth daily. 30 tablet 2     No facility-administered encounter  medications on file as of 06/11/2022.       Objective   There were no vitals taken for this visit.    Physical Exam     Assessment    ***   Plan   ***

## 2022-07-19 ENCOUNTER — Ambulatory Visit: Payer: PRIVATE HEALTH INSURANCE

## 2022-07-19 NOTE — Unmapped (Deleted)
Subjective   History of Present Illness:   Ronald Oconnor is a 56 y.o. male    Pre visit planning has been done.     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  There is evidence of slowing on NSC.  No evidence of axonal loss or acute denervation on needle EMG  There is no evidence of right ulnar neuropathy across the elbow.  There is evidence of a subacute-chronic right C6 radiculopathy.  There is evidence of acute denervation on needle EMG as well as reinnervation.    Health Maintenance Due   Topic Date Due    Immunization: Hepatitis B (1 of 3 - 3-dose series) Never done    Immunization: Pneumococcal (1 - PCV) Never done    Colorectal Cancer Screening (MyChart)  Never done    Lung Cancer Screening  Never done    Immunization: Zoster (2 of 2) 10/27/2019    Comprehensive Physical Exam  12/23/2020    Depression Screening  01/17/2021    Discuss PSA Screening  03/13/2021    Alcohol Misuse Screening  11/14/2021    Immunization: COVID-19 (4 - 2023-24 season) 05/03/2022    Immunization: Influenza (MyChart) (1) 05/03/2022       Histories:     He has no past medical history on file.    He has a past surgical history that includes Eye surgery.    His family history includes COPD in his father; Diabetes in his mother; Glaucoma in his father; Hypertension in his mother and paternal grandmother; Kidney failure in his father.    He reports that he has been smoking cigars. He has never used smokeless tobacco. He reports current alcohol use. He reports that he does not use drugs.      Review of Systems    Allergies:   Patient has no known allergies.    Medications:     Outpatient Encounter Medications as of 07/19/2022   Medication Sig Dispense Refill    allopurinoL (ZYLOPRIM) 100 MG tablet Take 1 tablet (100 mg total) by mouth daily.      amLODIPine (NORVASC) 5 MG tablet Take 1 tablet (5 mg total) by mouth daily. 30 tablet 2     No facility-administered encounter  medications on file as of 07/19/2022.       Objective   There were no vitals taken for this visit.    Physical Exam     Assessment    ***   Plan   ***

## 2022-09-09 ENCOUNTER — Ambulatory Visit: Payer: PRIVATE HEALTH INSURANCE

## 2022-09-09 NOTE — Progress Notes (Deleted)
Subjective   History of Present Illness:   Ronald Oconnor is a 57 y.o. male    Pre visit planning has been done.     HPI   Annual exam        Health Maintenance Due   Topic Date Due    Immunization: Hepatitis B (1 of 3 - 3-dose series) Never done    Immunization: Pneumococcal (1 - PCV) Never done    Colorectal Cancer Screening (MyChart)  Never done    Lung Cancer Screening  Never done    Immunization: Zoster (2 of 2) 10/27/2019    Comprehensive Physical Exam  12/23/2020    Depression Screening  01/17/2021    Discuss PSA Screening  03/13/2021    Alcohol Misuse Screening  11/14/2021    Immunization: COVID-19 (4 - 2023-24 season) 05/03/2022    Immunization: Influenza (MyChart) (1) 05/03/2022    Tobacco Cessation Readiness  08/24/2022           Procedure Note          [facility]  [place], [place] [address]  Phone: [phone]    Fax: [phone]     Test Date:  05/15/2022     Patient: Ronald Oconnor DOB: 06/26/1966 Physician: Georgina Peer, MD   Sex: Male Height: 6' 0 Ref Phys: Rosanna Randy, MD   ID#: [id number] Weight: 297 lbs. Technician:        Patient Complaints: Right arm weakness        Patient History:  Ronald Oconnor is a 57 y.o. male with 2 weeks of numbness/tingling in the right thumb and lateral forearm as well as right biceps and shoulder weakness.  Denies acute trauma or injury.  Denies neck pain or radiating pain.  Here for further assessment with EMG.       Reviewed EMG examination with patient including risks and benefits of procedure and expectations during and post EMG. Patient expressed understanding and exam initiated.     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  There is evidence of slowing on [place].  No evidence of axonal loss or acute denervation on needle EMG  There is no evidence of right ulnar neuropathy across the elbow.  There is evidence of a subacute-chronic right C6 radiculopathy.   There is evidence of acute denervation on needle EMG as well as reinnervation.           Histories:     He has no past medical history on file.    He has a past surgical history that includes Eye surgery.    His family history includes COPD in his father; Diabetes in his mother; Glaucoma in his father; Hypertension in his mother and paternal grandmother; Kidney failure in his father.    He reports that he has been smoking cigars. He has never used smokeless tobacco. He reports current alcohol use. He reports that he does not use drugs.      Review of Systems    Allergies:   Patient has no known allergies.    Medications:     Outpatient Encounter Medications as of 09/09/2022   Medication Sig Dispense Refill    allopurinoL (ZYLOPRIM) 100 MG tablet Take 1 tablet (100 mg total) by mouth daily.      amLODIPine (NORVASC) 5 MG tablet Take 1 tablet (5 mg total) by mouth daily. 30 tablet 2  No facility-administered encounter medications on file as of 09/09/2022.       Objective   There were no vitals taken for this visit.    Physical Exam     Assessment    ***   Plan   ***

## 2022-10-29 ENCOUNTER — Encounter
Admit: 2022-10-29 | Discharge: 2022-10-29 | Payer: BLUE CROSS/BLUE SHIELD | Attending: Internal Medicine | Primary: Internal Medicine

## 2022-10-29 DIAGNOSIS — Z7689 Persons encountering health services in other specified circumstances: Secondary | ICD-10-CM

## 2022-10-29 LAB — POCT URINALYSIS DIPSTICK
Bilirubin, UA: NEGATIVE
Blood, UA POC: NEGATIVE
Glucose, UA POC: NEGATIVE
Ketones, UA: NEGATIVE
Nitrite, UA: NEGATIVE
Protein, UA POC: NEGATIVE
Spec Grav, UA: 1.025
pH, UA: 6

## 2022-10-29 MED ORDER — B & B CARPAL TUNNEL BRACE MISC
Freq: Every evening | 0 refills | Status: AC
Start: 2022-10-29 — End: ?

## 2022-10-29 NOTE — Progress Notes (Signed)
SUBJECTIVE:  Marcus Castro is a 57 y.o. male HERE FOR   Chief Complaint   Patient presents with    New Patient     ESTABLISH CARE      PT HERE TO INITIATE CARE     PREVIOUS PCP: DR Doren Custard - [facility]    HTN - NOT ON MED NOW. + DIET / EXERCISE COMPLIANCE. NO HEADACHE, NO DIZZINESS  PREDIABETES - NOT ON MED. + DIET / EXERCISE REVIEWED  GOUT - ON MED. DENIES JT PAIN  OSA - ON CPAP - TOLERATING  C/O RT HAND DISCOMFORT - + NUMBNESS.OCC TINGLING, ? NO PAIN WAXES AND WANES, NO SWELLING. + COMPUTER USE. NO SHOULDER PAIN, NO ELBOW PAIN. MAY HAVE HAD EMG PREVIOUSLY  OBESITY - DIET / EXERCISE REVIEWED. WT NOTED. STATES + WT LOSS - INTENTIONAL PER PT  + FH PANCREATIC CA - MOTHER. RECENTLY LOST MOM  SCREENING LABS D/W PT  NEEDS FLU VACCINE      DENIES CP, No SOB, No PALPITATIONS, No COUGH, NO F/C  No ABD PAIN, No N/V, No DIARRHEA, No CONSTIPATION, No MELENA, No HEMATOCHEZIA.  No DYSURIA, No FREQ, No URGENCY, No HEMATURIA    PMH: REVIEWED AND UPDATED TODAY    PSH: REVIEWED AND UPDATED TODAY    SOCIAL HX: REVIEWED AND UPDATED TODAY    FAMILY HX: REVIEWED AND UPDATED TODAY    ALLERGY:  Patient has no known allergies.    MEDS: REVIEWED  Prior to Visit Medications    Medication Sig Taking? Authorizing Provider   ALLOPURINOL PO Take by mouth daily Yes [provider]   meloxicam (MOBIC) 15 MG tablet Take 1 tablet by mouth daily as needed for Pain Yes [provider]       IMMUNIZATION: INFLUENZA 2022, TDAP 2021, SHINGLES 12/20, COVID X 3      ROS: COMPREHENSIVE ROS AS IN HX, REST -VE  History obtained from chart review and the patient       OBJECTIVE:   NURSING NOTE AND VITALS REVIEWED  BP (!) 136/90   Pulse 81   Ht 1.803 m (5' 11"$ )   Wt 122.9 kg (271 lb)   SpO2 98%   BMI 37.80 kg/m     NO ACUTE DISTRESS    REPEAT BP:  120/80 (LT), NO ORTHOSTASIS     TEMP: 98.19F    Body mass index is 37.8 kg/m.     HEENT: NO PALLOR, ANICTERIC, PERRLA, EOMI, NO CONJUNCTIVAL ERYTHEMA,                 NO SINUS TENDERNESS  NECK:  SUPPLE,  TRACHEA MIDLINE, NT, NO JVD, NO CB, NO LA, NO TM, NO STIFFNESS  CHEST: RESPY EFFORT NL, GOOD AE, NO W/R/C  HEART: S1S2+ REG, NO M/G/R  ABD: OBESE, SOFT, NT, NO HSM, BS+  EXT: NO EDEMA, NT, PULSES +. HOMAN'S -VE  NEURO: ALERT AND ORIENTED X 3, NO MENINGEAL SIGNS, NO TREMORS, NL GAIT, NO FOCAL DEFICITS  PSYCH: FAIRLY GOOD AFFECT  BACK: NT, NO ROM, NO CVA TENDERNESS  SHOULDERS: NT, NO ROM, OCC CREPITUS - LT  ELBOWS, NO SWELLING, NT, NO ROM  HANDS / WRISTS: FLEXION DEFORMITY LT 5TH FINGER - OLD, NO SWELLING, NO LOCALIZED TENDERNESS, NO ROM, TINEL'S/ PHELAN'S SIGNS NEGATIVE LT, EQUIVOCAL RT       PREVIOUS LABS REVIEWED AND D/W PT    UA: TRACE LEUKOCYTES    ASSESSMENT / PLAN:     Diagnosis Orders   1. Encounter to establish care  COUNSELLED. HEALTH / SAFETY EDUCATION REVIEWED AND ADVISED  HEALTH MAINTENANCE UPDATED  ADVISED ON OVERALL HEALTH AND WELLNESS  SEE BELOW       2. Primary hypertension  COUNSELLED. CONTROLLED.  ADVISED LOW NA+ / DASH DIET/ EXERCISE. MONITOR. GOAL </= 130/80  F/U LABS  MAKE CHANGES AS NEEDED.        3. Prediabetes  COUNSELLED. ADVISED ON DIET / EXERCISE  ADVISED RISK FACTOR MODIFICATION.  F/U LABS TO [place]. MONITOR  MAKE CHANGES AS NEEDED.       4. Gout, arthritis  COUNSELLED.  MONITOR ON ON ALLOPURINOL F/U LABS  MAKE CHANGES AS NEEDED.       5. OSA on CPAP  COUNSELLED. MONITOR C PAP.  F/U SLEEP MED  MAKE CHANGES AS NEEDED.        6. Numbness and tingling in right hand  COUNSELLED. MONITOR FOR CARPAL TUNNEL SYNDROME  ADVISED ON USE OF BRACE  ADVISED ON HOME EXERCISES  F/U LABS  ADVISED OBTAIN PREVIOUS EMG  MAKE CHANGES AS NEEDED.       7. Obesity (BMI 30-39.9)  COUNSELLED. DIET/ EXERCISE.ADVISED  LIFESTYLE MODIFICATION. WT LOSS ADVISED.  LABS TO EVAL. R/O DM, R/O THYROID D/O.  MONITOR AND MAKE CHANGES AS NEEDED.       8. Family history of pancreatic cancer  COUNSELLED. LIPASE WITH LABS  READDRESS FURTHER EVAL  AS NEEDED  MAKE CHANGES AS NEEDED.       9. Screening for condition  COUNSELLED. LABS  TO EVAL - OK WITH PT  MAKE CHANGES AS NEEDED.       10. Need for immunization against influenza  COUNSELLED. S/E D/W PT. FLU VACCINE GIVEN. PT TOLERATED.                         MEDICATION SIDE EFFECTS D/W PATIENT      RETURN TO CLINIC WITHIN 3 MONTHS / PRN    FOLLOW UP FOR FASTING LABS

## 2022-10-29 NOTE — Patient Instructions (Signed)
TAKE MED AS ADVISED    DIET/ EXERCISE.    FOLLOW UP WITHIN 3 MONTHS / AS NEEDED    FOLLOW UP FOR FASTING [facility] Locations - No appointment necessary.  ? indicates the location is open Saturdays in addition to Monday through Friday.   Call your preferred location for test preparation, business hours and other information you need.   [facility] accepts [employer].  CENTRAL  EAST  WEST    ? [place]   [address] [place] [address]    Ph: [place] [address]    Ph: [phone]   ? [address]    [place] [address]    Ph: [place] [address]    Ph: [phone] ? [place] [address]   Ph: [phone]  ? [place] [address]    Ph: [place] [address]   Ph: [phone]    NORTH    ? [place]   [address] [place] [address]    Ph: [phone]  [facility]   [place] [address]   Ph: [phone]  [facility]   [address]   [place], [address]    [place]: [phone] [address]   [address] [place] [address]    Ph: [phone]  [address]  [place], [place] [id number]  Ph: [place] [place] [address]    Ph: [phone]

## 2022-12-25 NOTE — Plan of Care (Signed)
Reviewed patients chart, sent message via MyChart to discuss care management

## 2023-01-29 ENCOUNTER — Encounter: Payer: BLUE CROSS/BLUE SHIELD | Attending: Internal Medicine | Primary: Internal Medicine

## 2023-02-03 ENCOUNTER — Encounter: Payer: BLUE CROSS/BLUE SHIELD | Attending: Internal Medicine | Primary: Internal Medicine

## 2024-01-08 NOTE — Other (Signed)
 This is a notification of a Discharge Alert generated from an ADT received from [employer]. This patient was admited to:The [facility] Admit AVWU:[id number] Discharge AOZH:[id number] Visit Type:Inpatient Diagnosis:Spinal stenosis,   cervical region Radiculopathy, cervical region Cervical disc disorder with myelopathy, high cervical region Encounter for other preprocedural examination

## 2024-01-08 NOTE — Other (Signed)
 This is a notification of a Admission Alert generated from an ADT received from [employer]. This patient was admited to:The [facility] Admit WUJW:[id number] Discharge Date: Visit Type:Inpatient Diagnosis:Spinal stenosis, cervical region   Radiculopathy, cervical region Cervical disc disorder with myelopathy, high cervical region Encounter for other preprocedural examination
